# Patient Record
Sex: Female | Born: 1955 | Race: White | Hispanic: No | Marital: Married | Smoking: Never smoker
Health system: Southern US, Community
[De-identification: ages and names within clinical notes are randomized; demographics above are authoritative.]

## PROBLEM LIST (undated history)

## (undated) DIAGNOSIS — E039 Hypothyroidism, unspecified: Secondary | ICD-10-CM

## (undated) DIAGNOSIS — C801 Malignant (primary) neoplasm, unspecified: Secondary | ICD-10-CM

## (undated) HISTORY — DX: Malignant (primary) neoplasm, unspecified: C80.1

## (undated) HISTORY — PX: THYROIDECTOMY: SHX17

---

## 1998-07-24 ENCOUNTER — Encounter: Payer: Self-pay | Admitting: Obstetrics and Gynecology

## 1998-07-24 ENCOUNTER — Ambulatory Visit (HOSPITAL_COMMUNITY): Admission: RE | Admit: 1998-07-24 | Discharge: 1998-07-24 | Payer: Self-pay | Admitting: Obstetrics and Gynecology

## 1998-12-26 ENCOUNTER — Other Ambulatory Visit: Admission: RE | Admit: 1998-12-26 | Discharge: 1998-12-26 | Payer: Self-pay | Admitting: Obstetrics and Gynecology

## 2000-06-23 ENCOUNTER — Other Ambulatory Visit: Admission: RE | Admit: 2000-06-23 | Discharge: 2000-06-23 | Payer: Self-pay | Admitting: Obstetrics and Gynecology

## 2001-07-30 ENCOUNTER — Other Ambulatory Visit: Admission: RE | Admit: 2001-07-30 | Discharge: 2001-07-30 | Payer: Self-pay | Admitting: Obstetrics and Gynecology

## 2001-08-26 ENCOUNTER — Ambulatory Visit (HOSPITAL_COMMUNITY): Admission: RE | Admit: 2001-08-26 | Discharge: 2001-08-26 | Payer: Self-pay | Admitting: Family Medicine

## 2001-08-26 ENCOUNTER — Encounter: Payer: Self-pay | Admitting: Family Medicine

## 2002-10-11 ENCOUNTER — Other Ambulatory Visit: Admission: RE | Admit: 2002-10-11 | Discharge: 2002-10-11 | Payer: Self-pay | Admitting: Obstetrics and Gynecology

## 2004-01-01 ENCOUNTER — Other Ambulatory Visit: Admission: RE | Admit: 2004-01-01 | Discharge: 2004-01-01 | Payer: Self-pay | Admitting: Obstetrics and Gynecology

## 2004-01-05 ENCOUNTER — Ambulatory Visit (HOSPITAL_COMMUNITY): Admission: RE | Admit: 2004-01-05 | Discharge: 2004-01-05 | Payer: Self-pay | Admitting: Obstetrics and Gynecology

## 2004-02-20 ENCOUNTER — Ambulatory Visit (HOSPITAL_COMMUNITY): Admission: RE | Admit: 2004-02-20 | Discharge: 2004-02-20 | Payer: Self-pay | Admitting: Surgery

## 2004-02-20 ENCOUNTER — Encounter (INDEPENDENT_AMBULATORY_CARE_PROVIDER_SITE_OTHER): Payer: Self-pay | Admitting: Specialist

## 2004-04-09 ENCOUNTER — Ambulatory Visit (HOSPITAL_COMMUNITY): Admission: RE | Admit: 2004-04-09 | Discharge: 2004-04-10 | Payer: Self-pay | Admitting: Surgery

## 2004-04-09 ENCOUNTER — Encounter (INDEPENDENT_AMBULATORY_CARE_PROVIDER_SITE_OTHER): Payer: Self-pay | Admitting: *Deleted

## 2005-01-28 ENCOUNTER — Encounter: Admission: RE | Admit: 2005-01-28 | Discharge: 2005-01-28 | Payer: Self-pay | Admitting: Endocrinology

## 2005-01-31 ENCOUNTER — Other Ambulatory Visit: Admission: RE | Admit: 2005-01-31 | Discharge: 2005-01-31 | Payer: Self-pay | Admitting: Obstetrics and Gynecology

## 2005-05-12 ENCOUNTER — Encounter: Admission: RE | Admit: 2005-05-12 | Discharge: 2005-05-12 | Payer: Self-pay | Admitting: Endocrinology

## 2005-10-28 ENCOUNTER — Encounter: Admission: RE | Admit: 2005-10-28 | Discharge: 2005-10-28 | Payer: Self-pay | Admitting: Endocrinology

## 2006-05-11 ENCOUNTER — Encounter: Admission: RE | Admit: 2006-05-11 | Discharge: 2006-05-11 | Payer: Self-pay | Admitting: Endocrinology

## 2008-06-28 ENCOUNTER — Encounter: Admission: RE | Admit: 2008-06-28 | Discharge: 2008-06-28 | Payer: Self-pay | Admitting: Internal Medicine

## 2010-05-26 ENCOUNTER — Encounter: Payer: Self-pay | Admitting: Endocrinology

## 2010-09-20 NOTE — Op Note (Signed)
[name], [name]           ACCOUNT NO.:  [id number]   MEDICAL RECORD NO.:  [id number]          PATIENT TYPE:  OIB   LOCATION:  5705                         FACILITY:  [facility]   PHYSICIAN:  Velora Heckler, MD      DATE OF BIRTH:  11-13-1955   DATE OF PROCEDURE:  04/09/2004  DATE OF DISCHARGE:                                 OPERATIVE REPORT   PREOPERATIVE DIAGNOSIS:  Thyroid nodule.   POSTOPERATIVE DIAGNOSIS:  Papillary thyroid carcinoma.   HISTORY OF PRESENT ILLNESS:  The patient is a 55 year old white female found  to have thyroid nodule on routine physical exam by Dr. Richardean Chimera.  Ultrasound performed in September 2005 demonstrated a 2.5 cm nodule in the  right thyroid lobe.  A 9 mm nodule was noted in the left thyroid lobe.  The  patient underwent fine-needle aspiration cytology of the right lobe.  This  showed nuclear changes with nuclear grooves and was worrisome for follicular  variant of papillary thyroid carcinoma.  The patient now comes to surgery  for right thyroid lobectomy and possible total thyroidectomy.   BODY OF REPORT:  Procedure was done in O.R. #17 at the [facility].  The patient was brought to the operating room and placed  in a supine position on the operating room table.  Following administration  of general anesthesia, the patient was positioned and then prepped and  draped in the usual strict aseptic fashion.  After ascertaining that an  adequate level of anesthesia had been obtained, a Kocher incision was made  with a #15 blade.  Dissection was carried down through subcutaneous tissues  and platysma.  Hemostasis was obtained with the electrocautery.  Skin flaps  were developed cephalad and caudad.  A Mahorner self-retaining retractor was  placed for exposure.  Strap muscles were incised in the midline.  Dissection  was begun on the right side.  Strap muscles were reflected laterally.  Venous tributaries were divided between small  Ligaclips.  Right thyroid lobe  appeared grossly normal, with the exception of a 2 cm nodule which was in  the superior pole located posteriorly.  This was gently mobilized.  Superior  pole vessels were ligated in continuity with 2-0 silk ties, medium  Ligaclips, and divided.  Inferior venous tributaries were ligated in  continuity with 2-0 silk ties and divided.  Gland was rolled anteriorly.  Care was taken to preserve parathyroid tissue and the recurrent laryngeal  nerve.  Branches of the inferior thyroid artery were divided between small  Ligaclips.  Ligament of Allyson [name] was transected, and the gland was rolled up  and onto the anterior trachea.  It was mobilized across the midline.  Isthmus was divided between hemostats.  Right thyroid lobe was completely  excised and was submitted to pathology.  Dr. Berneta Levins did a frozen  section showing a follicular lesion with Hurthle cell change.  There was no  overt sign of malignancy.  Isthmus was suture ligated with 3-0 Vicryl suture  ligature.   Next, the left thyroid lobe was explored.  There was a small  isolated firm  nodule in the inferior pole located posteriorly.  This was gently mobilized.  The remainder of the left lobe appeared normal.  The inferior venous  tributaries were divided between small Ligaclips.  The inferior nodule was  then excised using the electrocautery to completely excise the nodule from  the surrounding normal parenchyma.  Just the nodule was submitted to Dr. Berneta Levins for frozen section.  Frozen section does show papillary thyroid  carcinoma.   Therefore, the remaining thyroid lobe was completely excised.  This was  performed by ligating the superior pole vessels in continuity with 2-0 silk  ties and medium Ligaclips and dividing.  Gland was rolled anteriorly.  Branches of the inferior thyroid artery were divided between small  Ligaclips.  Parathyroid tissue and recurrent laryngeal nerve were identified  and  preserved.  Ligament of Allyson [name] was transected with the electrocautery,  and the gland was rolled up and onto the trachea and completely excised off  the anterior trachea.  Left lobe was then submitted to pathology for  permanent sectioning.  Neck was irrigated with warm saline.  Good hemostasis  was noted.  Surgicel was placed over the area of the recurrent nerves  bilaterally.  The strap muscles were reapproximated in the midline with  interrupted 3-0 Vicryl sutures.  Platysma was closed with interrupted 3-0  Vicryl sutures.  Skin was closed with a running 4-0 Vicryl subcuticular  suture.  Wound was washed and dried, and Benzoin and Steri-Strips were  applied.  Sterile dressings were applied.  The patient was awakened from  anesthesia and brought to the recovery room in stable condition.  The  patient tolerated the procedure well.      Todd   TMG/MEDQ  D:  04/09/2004  T:  04/09/2004  Job:  [id number]   cc:   Velora Heckler, MD  [address]  [place]  [place] [address]  Fax: [phone]   Juluis Mire, M.D.  [address] [place]  [place] [address]  Fax: [phone]   Deboraha Sprang at [facility]

## 2010-12-31 ENCOUNTER — Emergency Department (HOSPITAL_COMMUNITY)
Admission: EM | Admit: 2010-12-31 | Discharge: 2011-01-01 | Disposition: A | Discharge status: Home / Self Care | Payer: BC Managed Care – PPO | Attending: Emergency Medicine | Admitting: Emergency Medicine

## 2010-12-31 DIAGNOSIS — R110 Nausea: Secondary | ICD-10-CM | POA: Insufficient documentation

## 2010-12-31 DIAGNOSIS — Z79899 Other long term (current) drug therapy: Secondary | ICD-10-CM | POA: Insufficient documentation

## 2010-12-31 DIAGNOSIS — R1013 Epigastric pain: Secondary | ICD-10-CM | POA: Insufficient documentation

## 2010-12-31 DIAGNOSIS — Z85850 Personal history of malignant neoplasm of thyroid: Secondary | ICD-10-CM | POA: Insufficient documentation

## 2011-01-01 ENCOUNTER — Emergency Department (HOSPITAL_COMMUNITY): Payer: BC Managed Care – PPO

## 2011-01-01 LAB — HEPATIC FUNCTION PANEL
ALT: 20 U/L (ref 0–35)
Bilirubin, Direct: <0.1 mg/dL (ref 0.0–0.3)

## 2011-01-01 LAB — DIFFERENTIAL
Basophils Relative: 0 % (ref 0–1)
Lymphocytes Relative: 34 % (ref 12–46)
Neutro Abs: 4.8 10*3/uL (ref 1.7–7.7)

## 2011-01-01 LAB — CBC
MCH: 30.1 pg (ref 26.0–34.0)
MCHC: 33.7 g/dL (ref 30.0–36.0)
MCV: 89.3 fL (ref 78.0–100.0)
RBC: 4.88 MIL/uL (ref 3.87–5.11)
RDW: 13.4 % (ref 11.5–15.5)
WBC: 8.5 10*3/uL (ref 4.0–10.5)

## 2011-01-01 LAB — BASIC METABOLIC PANEL
CO2: 30 mEq/L (ref 19–32)
Calcium: 10.7 mg/dL — ABNORMAL HIGH (ref 8.4–10.5)
GFR calc non Af Amer: >60 mL/min (ref >60)
Glucose, Bld: 81 mg/dL (ref 70–99)
Potassium: 3.7 mEq/L (ref 3.5–5.1)

## 2011-01-01 LAB — LIPASE, BLOOD: Lipase: 34 U/L (ref 11–59)

## 2011-01-01 MED ORDER — IOHEXOL 300 MG/ML  SOLN
100.0000 mL | Freq: Once | INTRAMUSCULAR | Status: AC | PRN
  Administered 2011-01-01: 100 mL via INTRAVENOUS

## 2012-03-17 IMAGING — US US ABDOMEN COMPLETE
1 series · 14 of 25 positions shown · non-contrast
Comparison: None

CLINICAL DATA: Abdominal pain.

COMPLETE ABDOMINAL ULTRASOUND

[Series 1: us abdomen complete · 0.30mm/px · 14 of 45 slices shown]
[im 1/45]
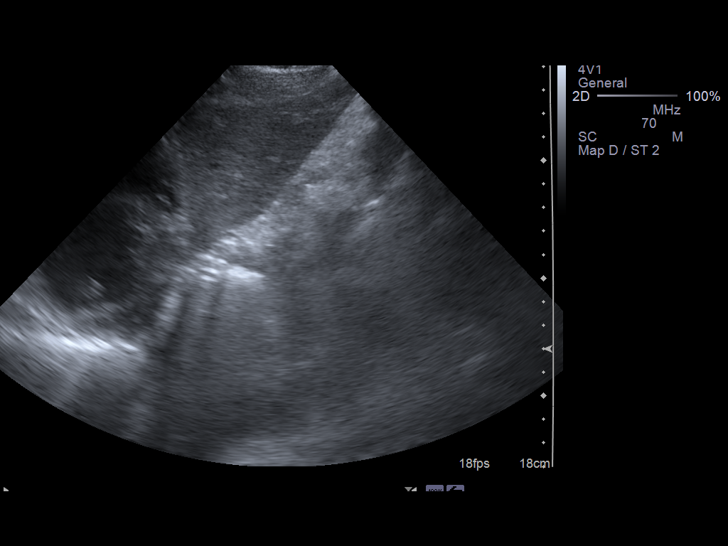
[im 4/45]
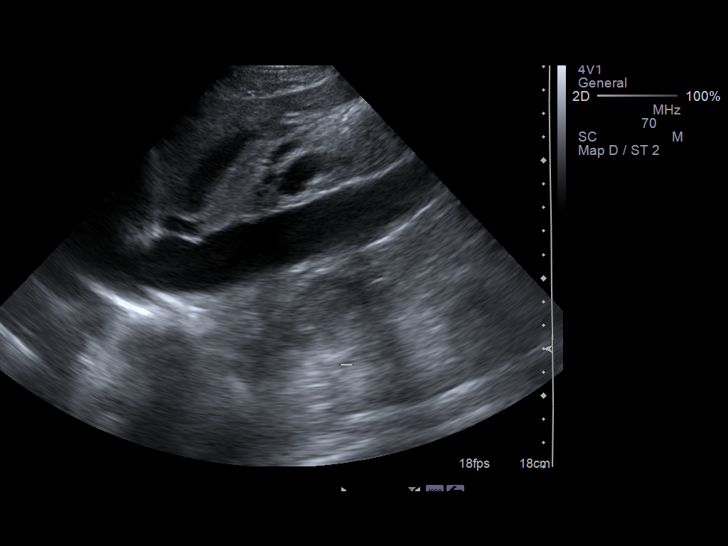
[im 8/45]
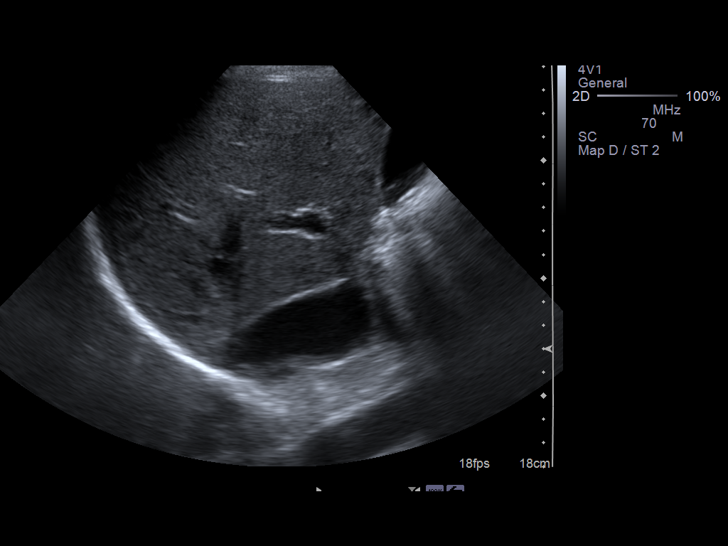
[im 12/45]
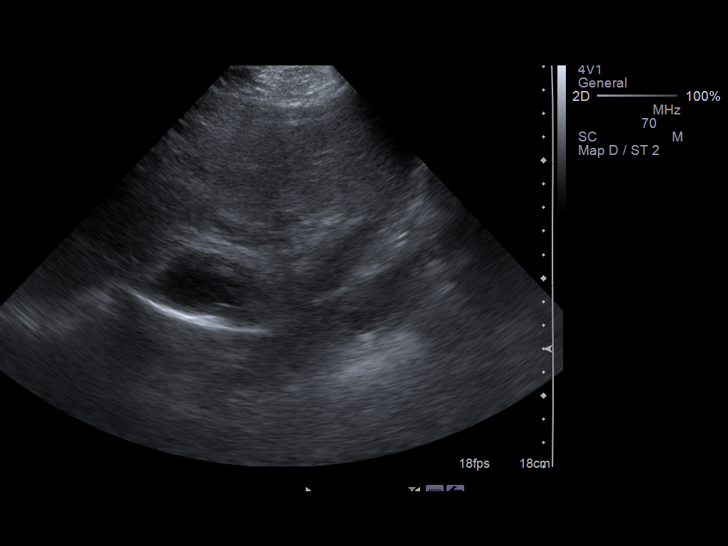
[im 15/45]
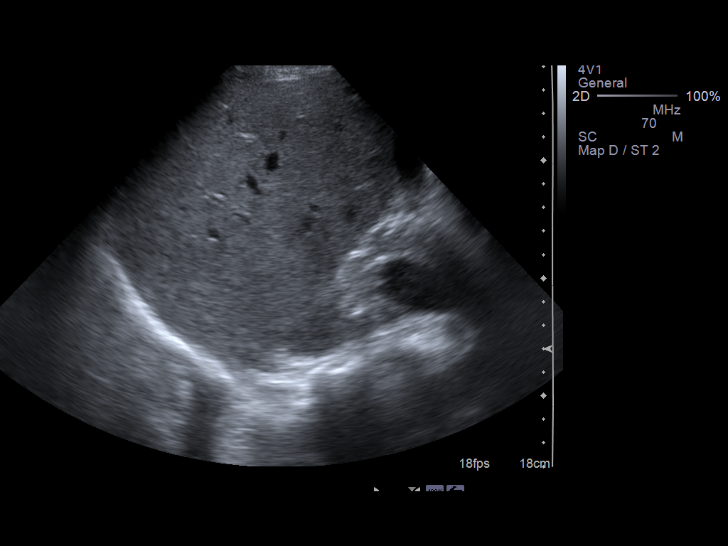
[im 17/45]
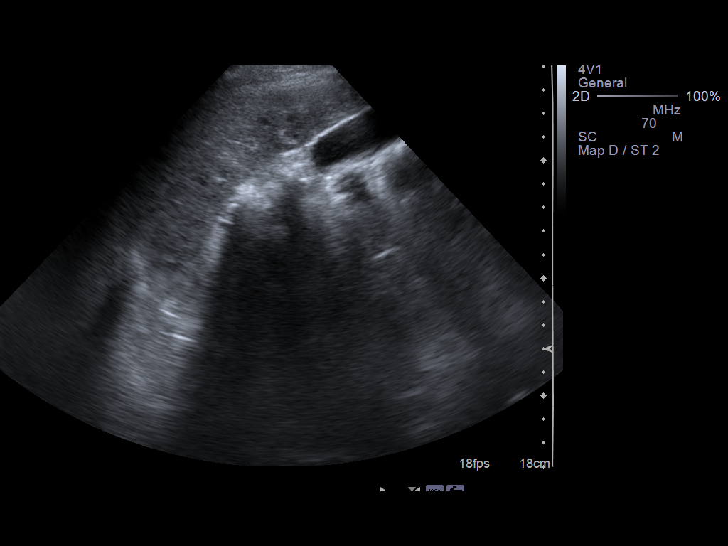
[im 21/45]
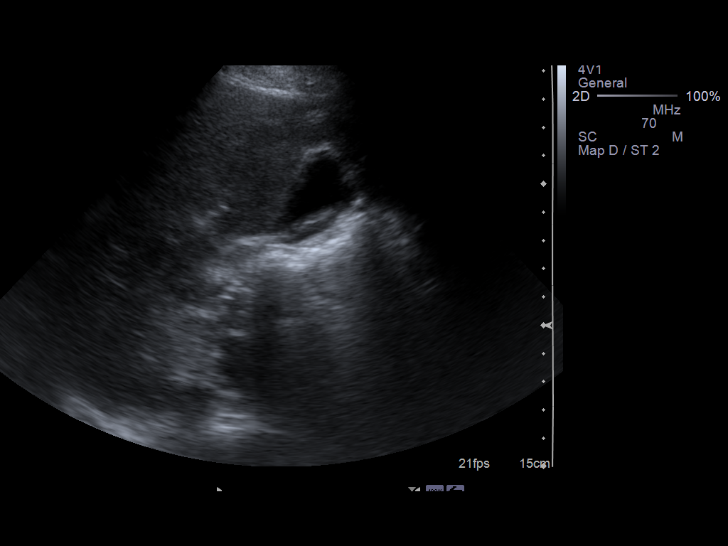
[im 24/45]
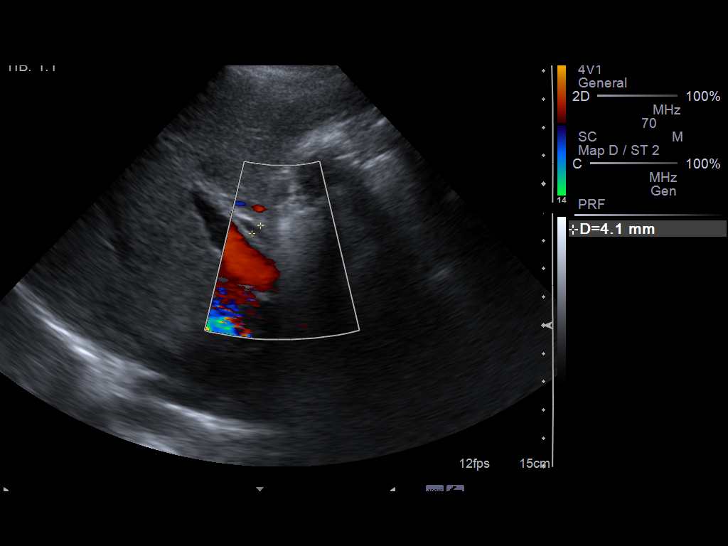
[im 28/45]
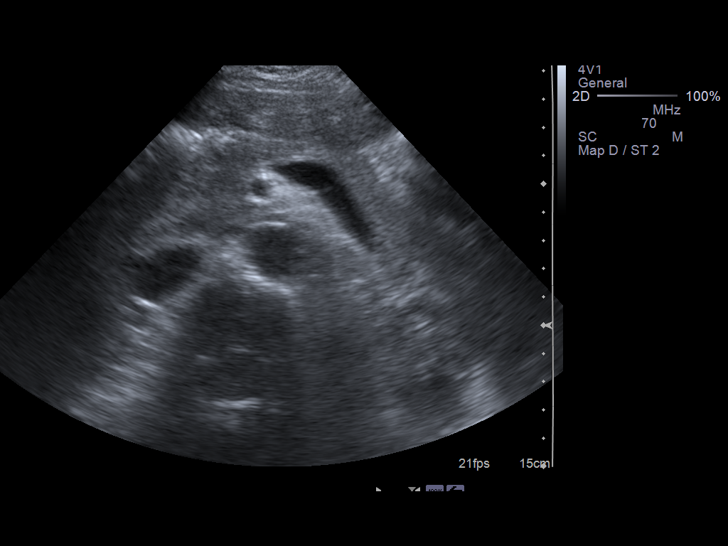
[im 30/45]
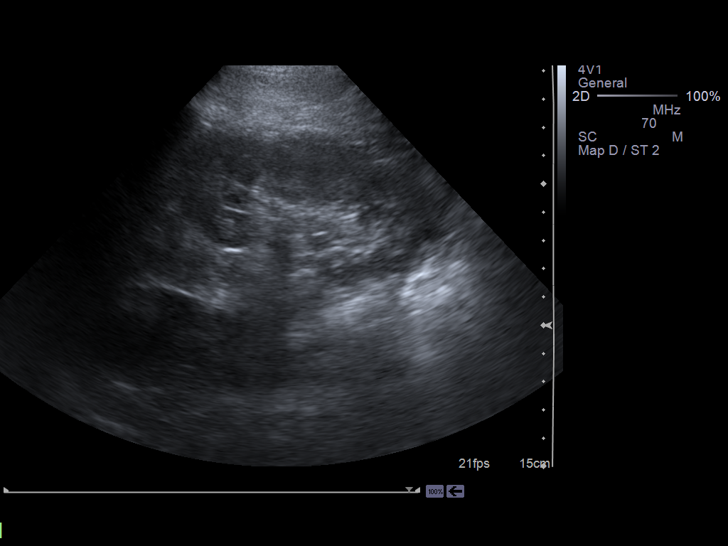
[im 34/45]
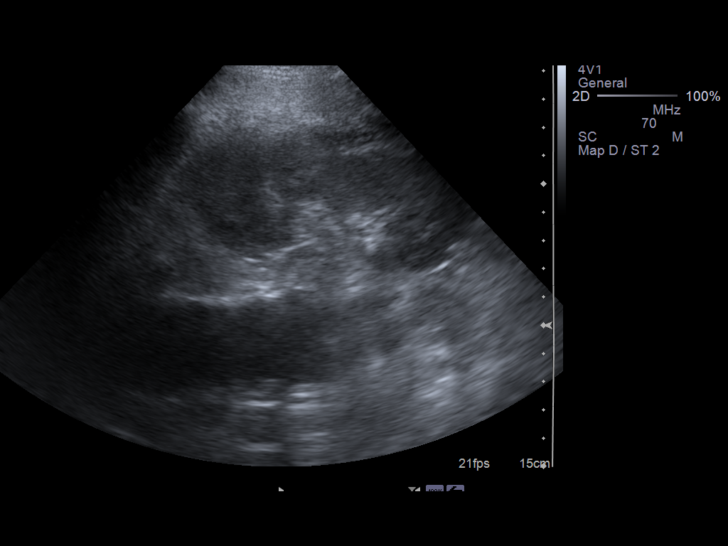
[im 37/45]
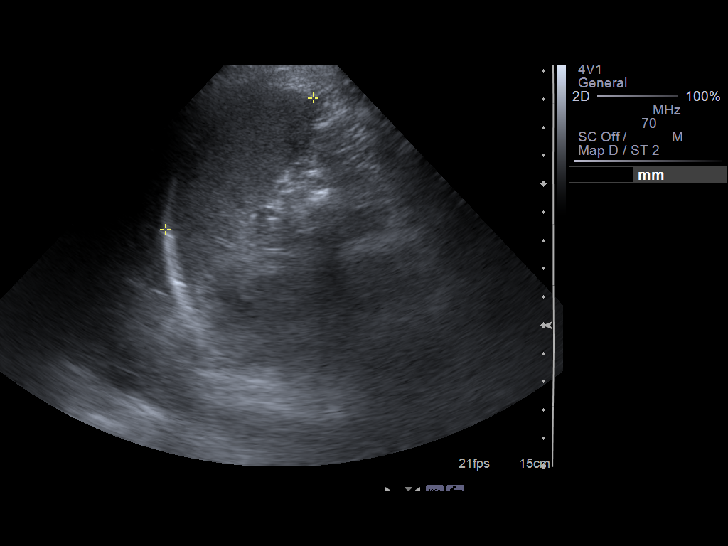
[im 41/45]
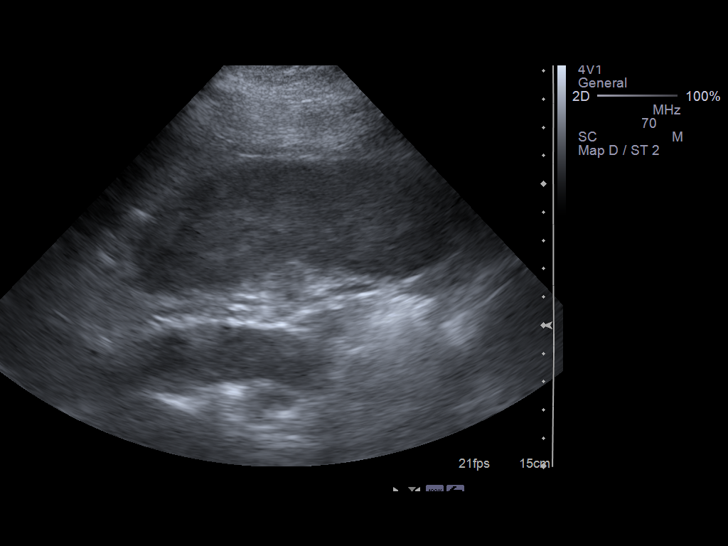
[im 45/45]
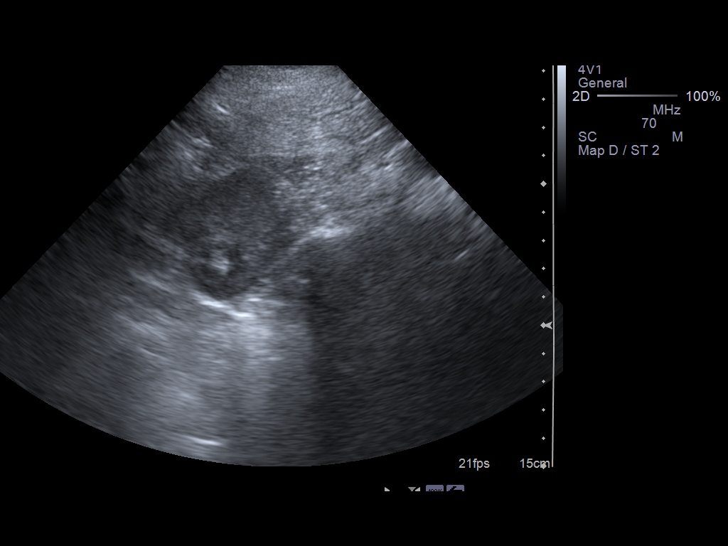

[14 of 25 positions shown; findings below may reference images not displayed]

FINDINGS: Gallbladder:  Normal sonographic appearance.  No gallstones, wall
thickening, or pericholecystic fluid.  Negative sonographic
Murphy's sign.

Common bile duct:  Normal diameter, measuring 4 mm.

Liver:  Normal echogenicity.  No focal lesion identified.

IVC:  Normal appearance.

Pancreas:  Limited visualization, normal where seen. Mild fullness
of the tail is nonspecific.  No focal lesion identified.

Spleen:  Normal in echogenicity and size.

Right Kidney:  Normal echogenicity.  No hydronephrosis.  Measures
10.6 cm.

Left Kidney:  Normal echogenicity.  No hydronephrosis.  Measures
11.7 cm.

Abdominal aorta:  No aneurysm identified.  Measures up to 2.5 cm.
IMPRESSION: No sonographic abnormality to explain the patient's abdominal pain.

## 2013-07-14 ENCOUNTER — Ambulatory Visit (INDEPENDENT_AMBULATORY_CARE_PROVIDER_SITE_OTHER): Payer: BC Managed Care – PPO | Admitting: Licensed Clinical Social Worker

## 2013-07-14 DIAGNOSIS — F4323 Adjustment disorder with mixed anxiety and depressed mood: Secondary | ICD-10-CM

## 2013-07-25 ENCOUNTER — Ambulatory Visit: Payer: BC Managed Care – PPO | Admitting: Licensed Clinical Social Worker

## 2014-06-13 ENCOUNTER — Other Ambulatory Visit: Payer: Self-pay | Admitting: Obstetrics and Gynecology

## 2014-06-14 LAB — CYTOLOGY - PAP

## 2014-07-16 ENCOUNTER — Ambulatory Visit (INDEPENDENT_AMBULATORY_CARE_PROVIDER_SITE_OTHER): Payer: BLUE CROSS/BLUE SHIELD

## 2014-07-16 ENCOUNTER — Telehealth: Payer: Self-pay

## 2014-07-16 ENCOUNTER — Ambulatory Visit (INDEPENDENT_AMBULATORY_CARE_PROVIDER_SITE_OTHER): Payer: BLUE CROSS/BLUE SHIELD | Admitting: Physician Assistant

## 2014-07-16 VITALS — BP 116/64 | HR 67 | Temp 97.9°F | Resp 16 | Ht 64.5 in | Wt 157.0 lb

## 2014-07-16 DIAGNOSIS — M79674 Pain in right toe(s): Secondary | ICD-10-CM

## 2014-07-16 DIAGNOSIS — S92424A Nondisplaced fracture of distal phalanx of right great toe, initial encounter for closed fracture: Secondary | ICD-10-CM | POA: Diagnosis not present

## 2014-07-16 DIAGNOSIS — E039 Hypothyroidism, unspecified: Secondary | ICD-10-CM | POA: Insufficient documentation

## 2014-07-16 MED ORDER — HYDROCODONE-ACETAMINOPHEN 5-325 MG PO TABS: 1.0000 | ORAL_TABLET | Freq: Four times a day (QID) | ORAL | Status: DC | PRN

## 2014-07-16 NOTE — Telephone Encounter (Signed)
Patient dropped off reimbursement forms to [employer] to complete  [phone] Jerilynn Mages)

## 2014-07-16 NOTE — Patient Instructions (Signed)
Recheck in 14 days Have a good intake of calcium

## 2014-07-16 NOTE — Progress Notes (Signed)
   Subjective:    Patient ID: Allison [name], female    DOB: 09-14-1955, 59 y.o.   MRN: [id number]  HPI Pt presents to clinic with right great toe pain after she dropped a loaded drawer on her right great toe 24h ago.  She had immediate pain and has used some motrin and ice but the pain is not getting better.    She has a bone density scheduled in 2 days for screening.  She takes calcium daily.  Review of Systems  Constitutional: Negative for fever and chills.  Musculoskeletal: Positive for gait problem (2nd to pain).       Objective:   Physical Exam  Constitutional: She appears well-developed and well-nourished.  BP 116/64 mmHg  Pulse 67  Temp(Src) 97.9 F (36.6 C)  Resp 16  Ht 5' 4.5" (1.638 m)  Wt 157 lb (71.215 kg)  BMI 26.54 kg/m2  SpO2 98%   HENT:  Head: Normocephalic and atraumatic.  Right Ear: External ear normal.  Left Ear: External ear normal.  Pulmonary/Chest: Effort normal.  Musculoskeletal:       Feet:  Antalgic gait favoring right side  Neurological: She is alert.  Skin: Skin is warm and dry.  Psychiatric: She has a normal mood and affect. Her behavior is normal. Judgment and thought content normal.    [facility] reading (PRIMARY) by  Dr. Everlene Farrier.  Fracture distal phalanx right great toe.     Assessment & Plan:  Closed nondisplaced fracture of distal phalanx of right great toe, initial encounter - Plan: HYDROcodone-acetaminophen (NORCO/VICODIN) 5-325 MG per tablet  Great toe pain, right - Plan: DG Toe Great Right   Post-op shoe.  Recheck in 14 days - sooner if worse.  Take 1500mg  calcium daily.  Windell Hummingbird PA-C  [facility] 07/16/2014 1:50 PM

## 2014-07-16 NOTE — Telephone Encounter (Signed)
For is ready to be copied and then for patient to pick up

## 2014-07-18 NOTE — Telephone Encounter (Signed)
Called pt, advised forms are ready to pick up.

## 2014-08-30 ENCOUNTER — Ambulatory Visit (INDEPENDENT_AMBULATORY_CARE_PROVIDER_SITE_OTHER): Payer: BLUE CROSS/BLUE SHIELD | Admitting: Urgent Care

## 2014-08-30 VITALS — BP 120/80 | HR 72 | Temp 97.6°F | Resp 16 | Ht 65.0 in | Wt 154.0 lb

## 2014-08-30 DIAGNOSIS — R51 Headache: Secondary | ICD-10-CM | POA: Diagnosis not present

## 2014-08-30 DIAGNOSIS — J101 Influenza due to other identified influenza virus with other respiratory manifestations: Secondary | ICD-10-CM | POA: Diagnosis not present

## 2014-08-30 DIAGNOSIS — R05 Cough: Secondary | ICD-10-CM | POA: Diagnosis not present

## 2014-08-30 DIAGNOSIS — M791 Myalgia, unspecified site: Secondary | ICD-10-CM

## 2014-08-30 DIAGNOSIS — R519 Headache, unspecified: Secondary | ICD-10-CM

## 2014-08-30 DIAGNOSIS — R110 Nausea: Secondary | ICD-10-CM

## 2014-08-30 DIAGNOSIS — R059 Cough, unspecified: Secondary | ICD-10-CM

## 2014-08-30 LAB — POCT INFLUENZA A/B
INFLUENZA A, POC: POSITIVE
INFLUENZA B, POC: NEGATIVE

## 2014-08-30 MED ORDER — KETOROLAC TROMETHAMINE 60 MG/2ML IM SOLN
60.0000 mg | Freq: Once | INTRAMUSCULAR | Status: AC
  Administered 2014-08-30: 60 mg via INTRAMUSCULAR

## 2014-08-30 MED ORDER — ONDANSETRON 4 MG PO TBDP
8.0000 mg | ORAL_TABLET | Freq: Once | ORAL | Status: AC
  Administered 2014-08-30: 8 mg via ORAL

## 2014-08-30 MED ORDER — ONDANSETRON 8 MG PO TBDP: 8.0000 mg | ORAL_TABLET | Freq: Three times a day (TID) | ORAL | Status: DC | PRN

## 2014-08-30 NOTE — Progress Notes (Signed)
    MRN: [id number] DOB: Jan 08, 1956  Subjective:   Allison [name] is a 59 y.o. female presenting for chief complaint of Headache and Flu like symptoms  Reports 2 day history of headache, subjective fever, myalgia, nasal congestion, sinus pressure, dry cough, nausea. Has tried Alleve, Tylenol, ibuprofen with minimal relief. Patient did take flu shot this year. Had sick contacts at work, ~1-2 weeks ago. Denies itchy or watery red eyes, ear pain, ear drainage, tooth pain, sore throat, chest pain, chest tightness, shob, wheezing, abdominal pain, diarrhea. Denies history of seasonal allergies, history of asthma. Denies history of kidney or liver disease. Denies any other aggravating or relieving factors, no other questions or concerns.  Allison [name] is currently taking levothyroxine. She is allergic to biaxin.  Allison [name]  has a past medical history of Cancer. Also  has past surgical history that includes Thyroidectomy.  ROS As in subjective.  Objective:   Vitals: BP 120/80 mmHg  Pulse 72  Temp(Src) 97.6 F (36.4 C) (Oral)  Resp 16  Ht 5\' 5"  (1.651 m)  Wt 154 lb (69.854 kg)  BMI 25.63 kg/m2  SpO2 98%  Physical Exam  Constitutional: She is well-developed, well-nourished, and in no distress.  HENT:  TM's intact bilaterally, no effusions or erythema. Nares patent, nasal turbinates pink and moist. No sinus tenderness. Oropharynx clear, mucous membranes moist, dentition in good repair.  Eyes: Conjunctivae are normal. Right eye exhibits no discharge. Left eye exhibits no discharge. No scleral icterus.  Neck: Normal range of motion. Neck supple.  Cardiovascular: Normal rate, regular rhythm and intact distal pulses.  Exam reveals no gallop and no friction rub.   No murmur heard. Pulmonary/Chest: No stridor. No respiratory distress. She has no wheezes. She has no rales. She exhibits no tenderness.  Abdominal: Soft. Bowel sounds are normal. She exhibits no distension and no mass. There is no  tenderness.  Lymphadenopathy:    She has no cervical adenopathy.  Skin: Skin is warm and dry. No rash noted. No erythema. No pallor.   Results for orders placed or performed in visit on 08/30/14 (from the past 24 hour(s))  POCT Influenza A/B     Status: Abnormal   Collection Time: 08/30/14  8:52 AM  Result Value Ref Range   Influenza A, POC Positive    Influenza B, POC Negative    Assessment and Plan :   1. Nonintractable headache, unspecified chronicity pattern, unspecified headache type 2. Myalgia 3. Cough 4. Nausea without vomiting 5. Influenza A - Offered supportive care, rest from work, return to clinic in 3 days if symptoms fail to resolve  Jaynee Eagles, PA-C [facility] [phone] 08/30/2014 8:24 AM

## 2014-08-30 NOTE — Patient Instructions (Signed)

## 2015-09-30 IMAGING — CR DG TOE GREAT 2+V*R*
2 series · 2 of 2 positions shown · non-contrast
Comparison: None.

CLINICAL DATA: Right great toe pain after drop loaded drawer on
great toe.

EXAM:
RIGHT GREAT TOE

[AP]
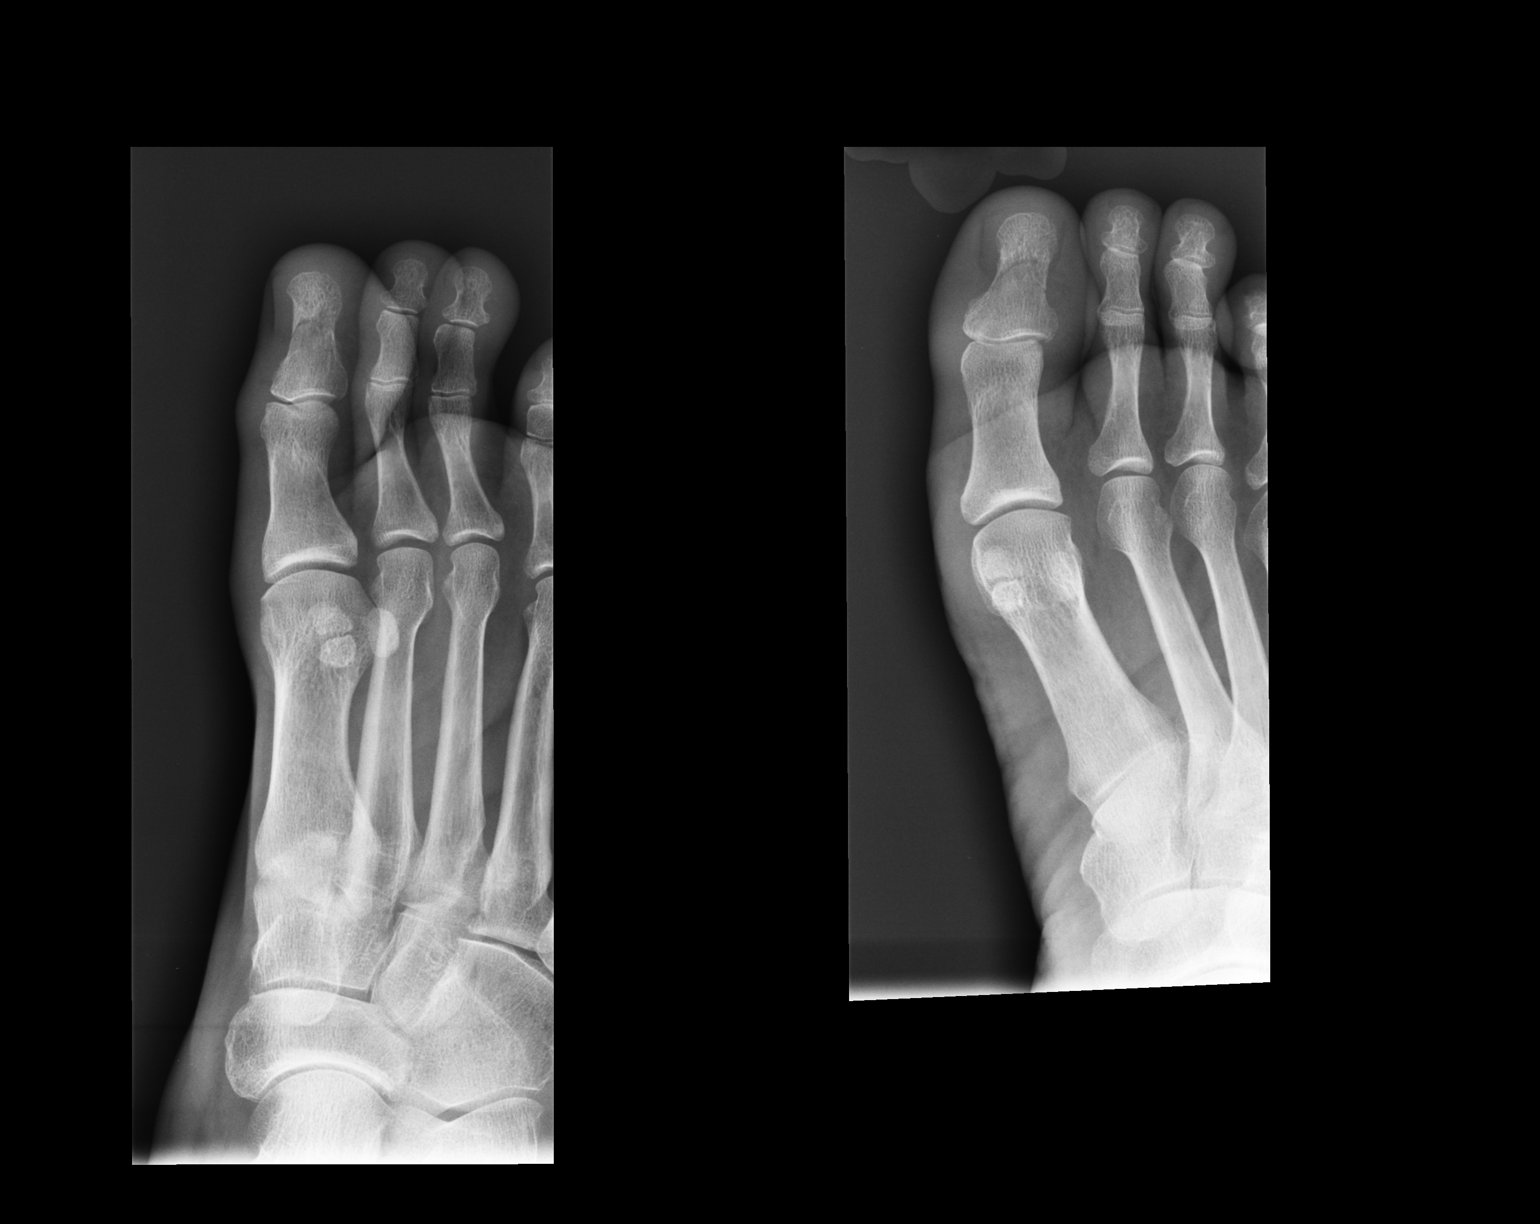

[oblique]
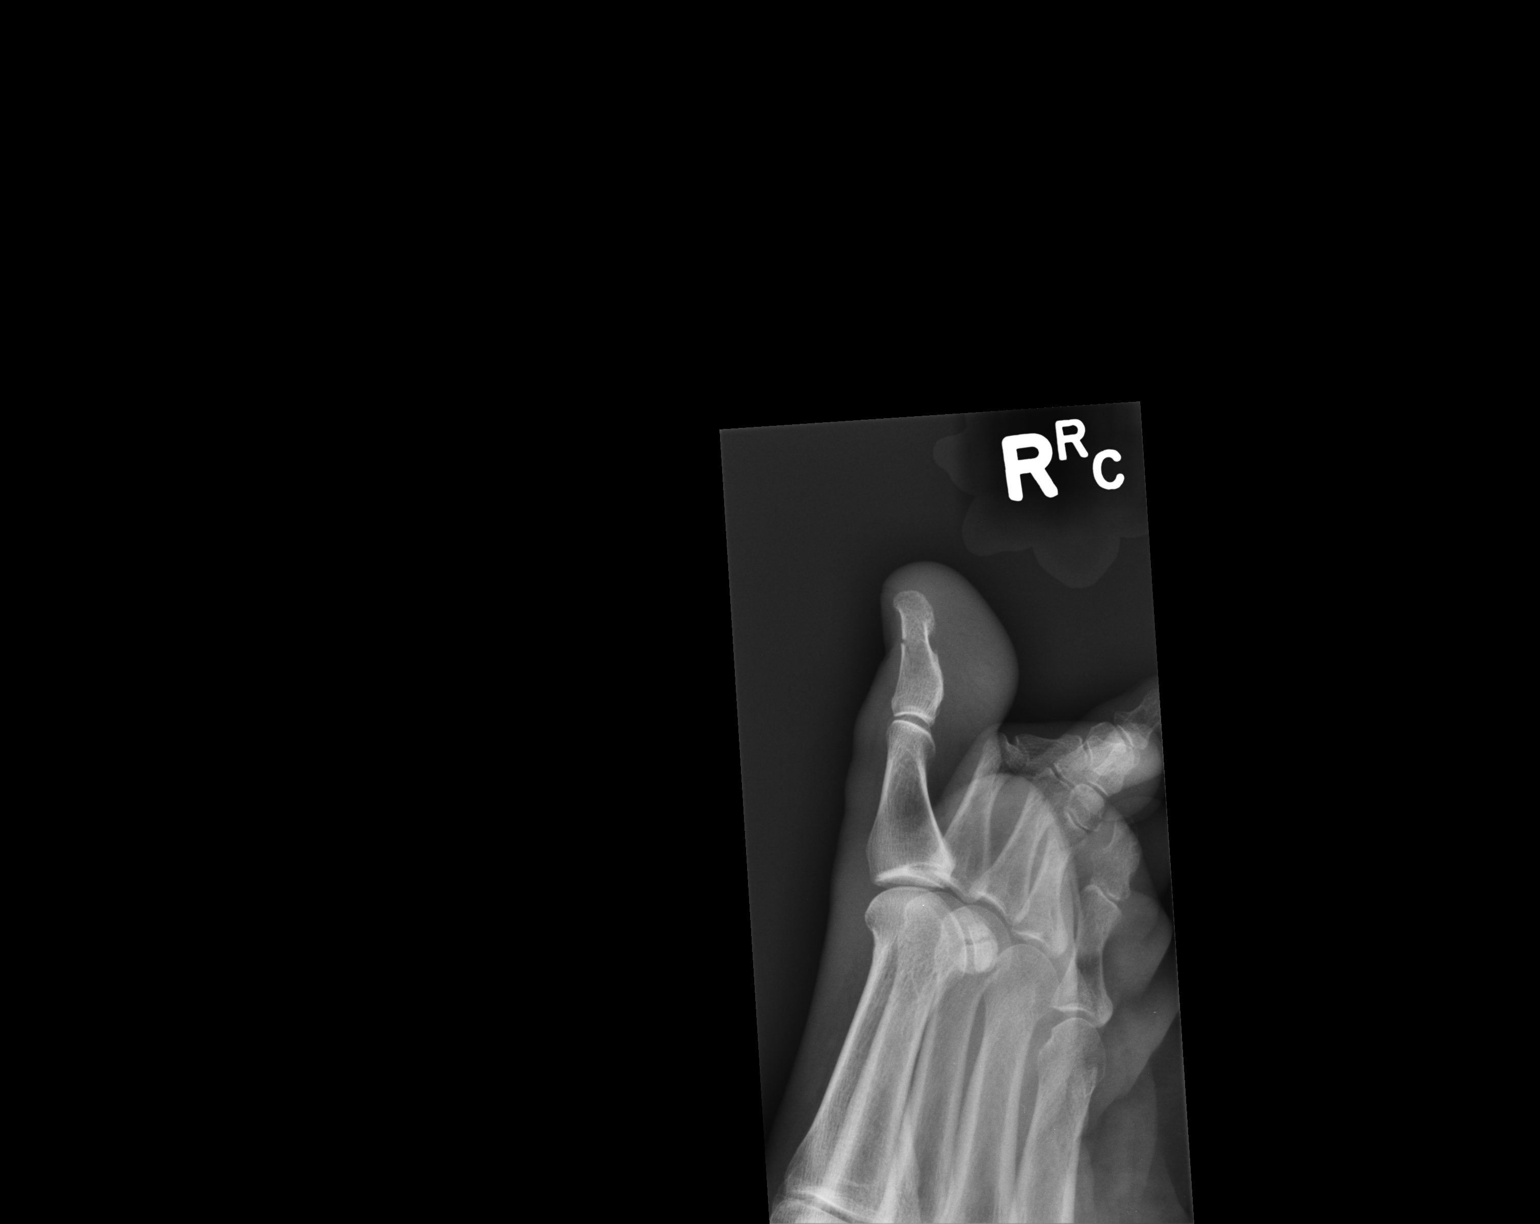

[2 of 2 positions shown; findings below may reference images not displayed]

FINDINGS: Nondisplaced fracture involves the mid shaft of the distal phalanx.
No dislocation. No radiopaque foreign bodies.
IMPRESSION: Acute fracture involves the mid shaft of the distal phalanx.

## 2017-01-30 ENCOUNTER — Other Ambulatory Visit (HOSPITAL_COMMUNITY): Payer: Self-pay | Admitting: Obstetrics and Gynecology

## 2017-02-03 ENCOUNTER — Encounter (HOSPITAL_COMMUNITY): Payer: Self-pay

## 2017-02-03 NOTE — Patient Instructions (Signed)
Allison [name]  02/03/2017      Your procedure is scheduled on 02/16/2017    Report to [facility].  Call this number if you have problems the morning of surgery:[phone]             OUR ADDRESS IS [place] , WE ARE LOCATED IN [place].    Remember:  Do not eat food or drink liquids after midnight.  Take these medicines the morning of surgery with A SIP OF WATER: SYNTHROID   Do not wear jewelry, make-up or nail polish.  Do not wear lotions, powders, or perfumes, or deoderant.  Do not shave 48 hours prior to surgery.  Men may shave face and neck.  Do not bring valuables to the hospital.  [facility] is not responsible for any belongings or valuables.  Contacts, dentures or bridgework may not be worn into surgery.  Leave your suitcase in the car.  After surgery it may be brought to your room.  For patients admitted to the hospital, discharge time will be determined by your treatment team.   Special instructions:   Please read over the following fact sheets that you were given.    [facility] - Preparing for Surgery Before surgery, you can play an important role.  Because skin is not sterile, your skin needs to be as free of germs as possible.  You can reduce the number of germs on your skin by washing with CHG (chlorahexidine gluconate) soap before surgery.  CHG is an antiseptic cleaner which kills germs and bonds with the skin to continue killing germs even after washing. Please DO NOT use if you have an allergy to CHG or antibacterial soaps.  If your skin becomes reddened/irritated stop using the CHG and inform your nurse when you arrive at Short Stay. Do not shave (including legs and underarms) for at least 48 hours prior to the first CHG shower.  You may shave your face/neck. Please follow these instructions carefully:  1.  Shower with CHG Soap the night before surgery and the   morning of Surgery.  2.  If you choose to wash your hair, wash your hair first as usual with your  normal  shampoo.  3.  After you shampoo, rinse your hair and body thoroughly to remove the  shampoo.                           4.  Use CHG as you would any other liquid soap.  You can apply chg directly  to the skin and wash                       Gently with a scrungie or clean washcloth.  5.  Apply the CHG Soap to your body ONLY FROM THE NECK DOWN.   Do not use on face/ open                           Wound or open sores. Avoid contact with eyes, ears mouth and genitals (private parts).  Wash face,  Genitals (private parts) with your normal soap.             6.  Wash thoroughly, paying special attention to the area where your surgery  will be performed.  7.  Thoroughly rinse your body with warm water from the neck down.  8.  DO NOT shower/wash with your normal soap after using and rinsing off  the CHG Soap.                9.  Pat yourself dry with a clean towel.            10.  Wear clean pajamas.            11.  Place clean sheets on your bed the night of your first shower and do not  sleep with pets. Day of Surgery : Do not apply any lotions/deodorants the morning of surgery.  Please wear clean clothes to the hospital/surgery center.  FAILURE TO FOLLOW THESE INSTRUCTIONS MAY RESULT IN THE CANCELLATION OF YOUR SURGERY PATIENT SIGNATURE_________________________________  NURSE SIGNATURE__________________________________  ________________________________________________________________________

## 2017-02-04 NOTE — H&P (Signed)
Patient Allison [name], Allison [name]  [facility], MD 02/04/2017 9:19 AM

## 2017-02-04 NOTE — H&P (Signed)
NAMERAUL, WINTERHALTER NO.:  [id number]  MEDICAL RECORD NO.:  [id number]  LOCATION:                                 FACILITY:  PHYSICIAN:  Darlyn Chamber, M.D.        DATE OF BIRTH:  DATE OF ADMISSION: DATE OF DISCHARGE:                             HISTORY & PHYSICAL   DATE OF SURGERY:  February 16, 2017, at [facility] [place].  [place]:  The patient is a 61 year old, gravida 2, para 2, postmenopausal patient, who presents for laparoscopic-assisted vaginal hysterectomy.  We are also going to do vaginal repair of a cystocele as well as a rectocele, and we will do a sacrospinous ligament suspension.  In relation to the present admission, the patient does have worsening pelvic relaxation that is becoming increasingly symptomatic for her.  We discussed options including pessary or conservative management.  We did do urodynamic testing, really could not demonstrate leakage with coughing or sneezing.  She now presents for the above-noted surgery.  ALLERGIES:  She is allergic to [facility].  MEDICATIONS:  Include Synthroid 112 mcg.  PAST MEDICAL HISTORY:  Usual childhood diseases.  PAST SURGICAL HISTORY:  She has had 2 vaginal deliveries and a thyroidectomy  SOCIAL HISTORY:  Reveals no tobacco and minimal alcohol use.  FAMILY HISTORY:  Noncontributory.  REVIEW OF SYSTEMS:  Noncontributory.  PHYSICAL EXAMINATION:  VITAL SIGNS:  The patient is afebrile.  Stable vital signs. HEENT:  The patient is normocephalic.  Pupils equal, round, and reactive to light and accommodation.  Extraocular movements were intact.  Sclerae and conjunctiva clear.  Oropharynx clear. NECK:  Without thyromegaly. BREASTS:  Not examined. LUNGS:  Clear. CARDIOVASCULAR SYSTEM:  Regular rate.  No murmurs or gallops. ABDOMEN:  Benign.  No mass, organomegaly or tenderness. PELVIC:  Normal external genitalia.  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 Uterus normal size and shape.  Adnexa unremarkable. EXTREMITIES:  Trace edema. NEUROLOGIC:  Grossly within normal limits.  IMPRESSION: 1. Worsening pelvic relaxation. 2. Previous thyroidectomy for thyroid cancer.  PLAN:  The patient to undergo laparoscopic-assisted vaginal hysterectomy with bilateral salpingo-oophorectomy.  We will do anterior posterior repair and sacrospinous ligament suspension.  Cystoscopy will be performed.  Risks of surgery have been discussed including the risk of infection.  Risk of hemorrhage that could require transfusion with the risk of AIDS or hepatitis.  Risk of injury to adjacent organs including bladder, bowel, ureters that could require further exploratory surgery. Risk of deep venous thrombosis and pulmonary embolus.  The patient does understand the indications, risks, and alternatives.     Darlyn Chamber, M.D.     JSM/MEDQ  D:  02/04/2017  T:  02/04/2017  Job:  [id number]

## 2017-02-06 ENCOUNTER — Encounter (HOSPITAL_COMMUNITY)
Admission: RE | Admit: 2017-02-06 | Discharge: 2017-02-06 | Disposition: A | Discharge status: Home / Self Care | Payer: BLUE CROSS/BLUE SHIELD | Source: Ambulatory Visit | Attending: Obstetrics and Gynecology | Admitting: Obstetrics and Gynecology

## 2017-02-06 ENCOUNTER — Encounter (HOSPITAL_COMMUNITY): Payer: Self-pay

## 2017-02-06 DIAGNOSIS — Z85850 Personal history of malignant neoplasm of thyroid: Secondary | ICD-10-CM | POA: Diagnosis not present

## 2017-02-06 DIAGNOSIS — N8189 Other female genital prolapse: Secondary | ICD-10-CM | POA: Diagnosis not present

## 2017-02-06 DIAGNOSIS — Z01812 Encounter for preprocedural laboratory examination: Secondary | ICD-10-CM | POA: Insufficient documentation

## 2017-02-06 DIAGNOSIS — E890 Postprocedural hypothyroidism: Secondary | ICD-10-CM | POA: Diagnosis not present

## 2017-02-06 HISTORY — DX: Hypothyroidism, unspecified: E03.9

## 2017-02-06 LAB — CBC
HCT: 40.4 % (ref 36.0–46.0)
Hemoglobin: 13.5 g/dL (ref 12.0–15.0)
MCH: 30.8 pg (ref 26.0–34.0)
MCHC: 33.4 g/dL (ref 30.0–36.0)
MCV: 92.2 fL (ref 78.0–100.0)
PLATELETS: 239 10*3/uL (ref 150–400)
RBC: 4.38 MIL/uL (ref 3.87–5.11)
RDW: 13.6 % (ref 11.5–15.5)
WBC: 5.7 10*3/uL (ref 4.0–10.5)

## 2017-02-06 LAB — BASIC METABOLIC PANEL
ANION GAP: 8 (ref 5–15)
BUN: 20 mg/dL (ref 6–20)
CALCIUM: 9.6 mg/dL (ref 8.9–10.3)
CO2: 28 mmol/L (ref 22–32)
CREATININE: 0.85 mg/dL (ref 0.44–1.00)
Chloride: 103 mmol/L (ref 101–111)
GFR calc Af Amer: >60 mL/min (ref >60)
GLUCOSE: 67 mg/dL (ref 65–99)
Potassium: 4.5 mmol/L (ref 3.5–5.1)
Sodium: 139 mmol/L (ref 135–145)

## 2017-02-06 LAB — ABO/RH: ABO/RH(D): A POS

## 2017-02-15 NOTE — Anesthesia Preprocedure Evaluation (Signed)
Anesthesia Evaluation  Patient identified by MRN, date of birth, ID band Patient awake    Reviewed: Allergy & Precautions, H&P , Patient's Chart, lab work & pertinent test results, reviewed documented beta blocker date and time   Airway Mallampati: II  TM Distance: >3 FB Neck ROM: full    Dental no notable dental hx.    Pulmonary    Pulmonary exam normal breath sounds clear to auscultation       Cardiovascular  Rhythm:regular Rate:Normal     Neuro/Psych    GI/Hepatic   Endo/Other    Renal/GU      Musculoskeletal   Abdominal   Peds  Hematology   Anesthesia Other Findings   Reproductive/Obstetrics                             Anesthesia Physical Anesthesia Plan  ASA: II  Anesthesia Plan: General   Post-op Pain Management:    Induction: Intravenous  PONV Risk Score and Plan: 2 and Ondansetron and Dexamethasone  Airway Management Planned: Oral ETT  Additional Equipment:   Intra-op Plan:   Post-operative Plan: Extubation in OR  Informed Consent: I have reviewed the patients History and Physical, chart, labs and discussed the procedure including the risks, benefits and alternatives for the proposed anesthesia with the patient or authorized representative who has indicated his/her understanding and acceptance.   Dental Advisory Given  Plan Discussed with: CRNA and Surgeon  Anesthesia Plan Comments: (  )        Anesthesia Quick Evaluation

## 2017-02-16 ENCOUNTER — Encounter (HOSPITAL_BASED_OUTPATIENT_CLINIC_OR_DEPARTMENT_OTHER): Payer: Self-pay | Admitting: *Deleted

## 2017-02-16 ENCOUNTER — Ambulatory Visit (HOSPITAL_BASED_OUTPATIENT_CLINIC_OR_DEPARTMENT_OTHER): Payer: BLUE CROSS/BLUE SHIELD | Admitting: Anesthesiology

## 2017-02-16 ENCOUNTER — Encounter (HOSPITAL_BASED_OUTPATIENT_CLINIC_OR_DEPARTMENT_OTHER)
Admission: RE | Disposition: A | Discharge status: Home / Self Care | Payer: Self-pay | Source: Ambulatory Visit | Attending: Obstetrics and Gynecology

## 2017-02-16 ENCOUNTER — Ambulatory Visit (HOSPITAL_BASED_OUTPATIENT_CLINIC_OR_DEPARTMENT_OTHER)
Admission: RE | Admit: 2017-02-16 | Discharge: 2017-02-17 | Disposition: A | Discharge status: Home / Self Care | Payer: BLUE CROSS/BLUE SHIELD | Source: Ambulatory Visit | Attending: Obstetrics and Gynecology | Admitting: Obstetrics and Gynecology

## 2017-02-16 DIAGNOSIS — N816 Rectocele: Secondary | ICD-10-CM | POA: Diagnosis not present

## 2017-02-16 DIAGNOSIS — Z79899 Other long term (current) drug therapy: Secondary | ICD-10-CM | POA: Diagnosis not present

## 2017-02-16 DIAGNOSIS — Z888 Allergy status to other drugs, medicaments and biological substances status: Secondary | ICD-10-CM | POA: Insufficient documentation

## 2017-02-16 DIAGNOSIS — Z85850 Personal history of malignant neoplasm of thyroid: Secondary | ICD-10-CM | POA: Diagnosis not present

## 2017-02-16 DIAGNOSIS — N83202 Unspecified ovarian cyst, left side: Secondary | ICD-10-CM | POA: Diagnosis not present

## 2017-02-16 DIAGNOSIS — N8110 Cystocele, unspecified: Secondary | ICD-10-CM | POA: Diagnosis not present

## 2017-02-16 DIAGNOSIS — E890 Postprocedural hypothyroidism: Secondary | ICD-10-CM | POA: Insufficient documentation

## 2017-02-16 DIAGNOSIS — N83201 Unspecified ovarian cyst, right side: Secondary | ICD-10-CM | POA: Diagnosis not present

## 2017-02-16 DIAGNOSIS — N8189 Other female genital prolapse: Secondary | ICD-10-CM | POA: Diagnosis not present

## 2017-02-16 HISTORY — PX: LAPAROSCOPIC VAGINAL HYSTERECTOMY WITH SALPINGO OOPHORECTOMY: SHX6681

## 2017-02-16 HISTORY — PX: ANTERIOR AND POSTERIOR REPAIR WITH SACROSPINOUS FIXATION: SHX6536

## 2017-02-16 LAB — TYPE AND SCREEN
ABO/RH(D): A POS
ANTIBODY SCREEN: NEGATIVE

## 2017-02-16 SURGERY — HYSTERECTOMY, VAGINAL, LAPAROSCOPY-ASSISTED, WITH SALPINGO-OOPHORECTOMY
Anesthesia: General | Site: Vagina

## 2017-02-16 MED ORDER — DEXAMETHASONE SODIUM PHOSPHATE 10 MG/ML IJ SOLN
INTRAMUSCULAR | Status: AC
  Filled 2017-02-16: qty 1

## 2017-02-16 MED ORDER — SCOPOLAMINE 1 MG/3DAYS TD PT72
MEDICATED_PATCH | TRANSDERMAL | Status: AC
  Filled 2017-02-16: qty 1

## 2017-02-16 MED ORDER — LIDOCAINE-EPINEPHRINE (PF) 1 %-1:200000 IJ SOLN
INTRAMUSCULAR | Status: DC | PRN
  Administered 2017-02-16: 3 mL

## 2017-02-16 MED ORDER — ONDANSETRON HCL 4 MG/2ML IJ SOLN
INTRAMUSCULAR | Status: AC
  Filled 2017-02-16: qty 2

## 2017-02-16 MED ORDER — SUGAMMADEX SODIUM 200 MG/2ML IV SOLN
INTRAVENOUS | Status: AC
  Filled 2017-02-16: qty 2

## 2017-02-16 MED ORDER — DEXAMETHASONE SODIUM PHOSPHATE 10 MG/ML IJ SOLN
INTRAMUSCULAR | Status: DC | PRN
  Administered 2017-02-16: 10 mg via INTRAVENOUS

## 2017-02-16 MED ORDER — FENTANYL CITRATE (PF) 100 MCG/2ML IJ SOLN
INTRAMUSCULAR | Status: AC
  Filled 2017-02-16: qty 2

## 2017-02-16 MED ORDER — EPHEDRINE 5 MG/ML INJ
INTRAVENOUS | Status: AC
  Filled 2017-02-16: qty 10

## 2017-02-16 MED ORDER — OXYCODONE-ACETAMINOPHEN 5-325 MG PO TABS
1.0000 | ORAL_TABLET | ORAL | Status: DC | PRN
  Administered 2017-02-16 – 2017-02-17 (×4): 1 via ORAL
  Filled 2017-02-16: qty 2

## 2017-02-16 MED ORDER — LACTATED RINGERS IV SOLN
INTRAVENOUS | Status: DC
  Administered 2017-02-16 (×3): via INTRAVENOUS
  Filled 2017-02-16: qty 1000

## 2017-02-16 MED ORDER — BUPIVACAINE HCL (PF) 0.25 % IJ SOLN
INTRAMUSCULAR | Status: DC | PRN
  Administered 2017-02-16: 4 mL

## 2017-02-16 MED ORDER — CEFAZOLIN SODIUM-DEXTROSE 2-4 GM/100ML-% IV SOLN
INTRAVENOUS | Status: AC
  Filled 2017-02-16: qty 100

## 2017-02-16 MED ORDER — METOCLOPRAMIDE HCL 5 MG/ML IJ SOLN
INTRAMUSCULAR | Status: DC | PRN
  Administered 2017-02-16: 5 mg via INTRAVENOUS

## 2017-02-16 MED ORDER — DIPHENHYDRAMINE HCL 50 MG/ML IJ SOLN
12.5000 mg | Freq: Four times a day (QID) | INTRAMUSCULAR | Status: DC | PRN
  Filled 2017-02-16: qty 0.25

## 2017-02-16 MED ORDER — OXYCODONE-ACETAMINOPHEN 5-325 MG PO TABS
ORAL_TABLET | ORAL | Status: AC
  Filled 2017-02-16: qty 1

## 2017-02-16 MED ORDER — ROCURONIUM BROMIDE 50 MG/5ML IV SOSY
PREFILLED_SYRINGE | INTRAVENOUS | Status: AC
  Filled 2017-02-16: qty 5

## 2017-02-16 MED ORDER — PROPOFOL 10 MG/ML IV BOLUS
INTRAVENOUS | Status: DC | PRN
Start: 2017-02-16 — End: 2017-02-16
  Administered 2017-02-16: 150 mg via INTRAVENOUS

## 2017-02-16 MED ORDER — ONDANSETRON HCL 4 MG/2ML IJ SOLN
4.0000 mg | Freq: Four times a day (QID) | INTRAMUSCULAR | Status: DC | PRN
  Administered 2017-02-16: 4 mg via INTRAVENOUS
  Filled 2017-02-16: qty 2

## 2017-02-16 MED ORDER — FENTANYL CITRATE (PF) 100 MCG/2ML IJ SOLN
INTRAMUSCULAR | Status: DC | PRN
  Administered 2017-02-16 (×4): 50 ug via INTRAVENOUS

## 2017-02-16 MED ORDER — CEFAZOLIN SODIUM-DEXTROSE 2-4 GM/100ML-% IV SOLN
2.0000 g | INTRAVENOUS | Status: AC
  Administered 2017-02-16: 2 g via INTRAVENOUS
  Filled 2017-02-16: qty 100

## 2017-02-16 MED ORDER — ROCURONIUM BROMIDE 10 MG/ML (PF) SYRINGE
PREFILLED_SYRINGE | INTRAVENOUS | Status: DC | PRN
  Administered 2017-02-16 (×2): 10 mg via INTRAVENOUS
  Administered 2017-02-16: 50 mg via INTRAVENOUS

## 2017-02-16 MED ORDER — MIDAZOLAM HCL 2 MG/2ML IJ SOLN
INTRAMUSCULAR | Status: DC | PRN
  Administered 2017-02-16: 2 mg via INTRAVENOUS

## 2017-02-16 MED ORDER — MIDAZOLAM HCL 2 MG/2ML IJ SOLN
INTRAMUSCULAR | Status: AC
  Filled 2017-02-16: qty 2

## 2017-02-16 MED ORDER — FENTANYL CITRATE (PF) 100 MCG/2ML IJ SOLN
25.0000 ug | INTRAMUSCULAR | Status: DC | PRN
  Administered 2017-02-16 (×4): 50 ug via INTRAVENOUS
  Filled 2017-02-16: qty 1

## 2017-02-16 MED ORDER — HYDROMORPHONE 1 MG/ML IV SOLN
INTRAVENOUS | Status: DC
  Administered 2017-02-16: 25 mg via INTRAVENOUS
  Administered 2017-02-16: 1.9 mg via INTRAVENOUS
  Filled 2017-02-16 (×2): qty 25

## 2017-02-16 MED ORDER — LIDOCAINE 2% (20 MG/ML) 5 ML SYRINGE
INTRAMUSCULAR | Status: AC
  Filled 2017-02-16: qty 10

## 2017-02-16 MED ORDER — SODIUM CHLORIDE 0.9 % IR SOLN
Status: DC | PRN
  Administered 2017-02-16: 3000 mL via INTRAVESICAL

## 2017-02-16 MED ORDER — ONDANSETRON HCL 4 MG/2ML IJ SOLN
4.0000 mg | Freq: Four times a day (QID) | INTRAMUSCULAR | Status: DC | PRN
  Filled 2017-02-16: qty 2

## 2017-02-16 MED ORDER — KETOROLAC TROMETHAMINE 30 MG/ML IJ SOLN
INTRAMUSCULAR | Status: DC | PRN
  Administered 2017-02-16: 30 mg via INTRAMUSCULAR
  Administered 2017-02-16: 30 mg via INTRAVENOUS

## 2017-02-16 MED ORDER — SODIUM CHLORIDE 0.9% FLUSH
9.0000 mL | INTRAVENOUS | Status: DC | PRN
  Filled 2017-02-16: qty 10

## 2017-02-16 MED ORDER — ONDANSETRON HCL 4 MG PO TABS
4.0000 mg | ORAL_TABLET | Freq: Four times a day (QID) | ORAL | Status: DC | PRN
  Filled 2017-02-16: qty 1

## 2017-02-16 MED ORDER — LACTATED RINGERS IR SOLN
Status: DC | PRN
  Administered 2017-02-16: 3000 mL

## 2017-02-16 MED ORDER — ACETAMINOPHEN 325 MG PO TABS
650.0000 mg | ORAL_TABLET | ORAL | Status: DC | PRN
  Administered 2017-02-17: 325 mg via ORAL
  Filled 2017-02-16: qty 2

## 2017-02-16 MED ORDER — DIPHENHYDRAMINE HCL 12.5 MG/5ML PO ELIX
12.5000 mg | ORAL_SOLUTION | Freq: Four times a day (QID) | ORAL | Status: DC | PRN
  Filled 2017-02-16: qty 5

## 2017-02-16 MED ORDER — SUGAMMADEX SODIUM 200 MG/2ML IV SOLN
INTRAVENOUS | Status: DC | PRN
  Administered 2017-02-16: 150 mg via INTRAVENOUS

## 2017-02-16 MED ORDER — NALOXONE HCL 0.4 MG/ML IJ SOLN
0.4000 mg | INTRAMUSCULAR | Status: DC | PRN
  Filled 2017-02-16: qty 1

## 2017-02-16 MED ORDER — KETOROLAC TROMETHAMINE 30 MG/ML IJ SOLN
INTRAMUSCULAR | Status: AC
  Filled 2017-02-16: qty 1

## 2017-02-16 MED ORDER — METOCLOPRAMIDE HCL 5 MG/ML IJ SOLN
INTRAMUSCULAR | Status: AC
  Filled 2017-02-16: qty 2

## 2017-02-16 MED ORDER — MENTHOL 3 MG MT LOZG
1.0000 | LOZENGE | OROMUCOSAL | Status: DC | PRN
  Filled 2017-02-16: qty 9

## 2017-02-16 MED ORDER — ARTIFICIAL TEARS OPHTHALMIC OINT
TOPICAL_OINTMENT | OPHTHALMIC | Status: AC
  Filled 2017-02-16: qty 3.5

## 2017-02-16 MED ORDER — SCOPOLAMINE 1 MG/3DAYS TD PT72
MEDICATED_PATCH | TRANSDERMAL | Status: DC | PRN
  Administered 2017-02-16: 1 via TRANSDERMAL

## 2017-02-16 MED ORDER — ONDANSETRON HCL 4 MG/2ML IJ SOLN
INTRAMUSCULAR | Status: DC | PRN
  Administered 2017-02-16: 4 mg via INTRAVENOUS

## 2017-02-16 MED ORDER — EPHEDRINE SULFATE-NACL 50-0.9 MG/10ML-% IV SOSY
PREFILLED_SYRINGE | INTRAVENOUS | Status: DC | PRN
  Administered 2017-02-16: 15 mg via INTRAVENOUS
  Administered 2017-02-16: 10 mg via INTRAVENOUS

## 2017-02-16 MED ORDER — PROPOFOL 10 MG/ML IV BOLUS
INTRAVENOUS | Status: AC
  Filled 2017-02-16: qty 40

## 2017-02-16 MED ORDER — LIDOCAINE 2% (20 MG/ML) 5 ML SYRINGE
INTRAMUSCULAR | Status: DC | PRN
  Administered 2017-02-16: 100 mg via INTRAVENOUS

## 2017-02-16 MED ORDER — LACTATED RINGERS IV SOLN
INTRAVENOUS | Status: DC
  Administered 2017-02-16: 19:00:00 via INTRAVENOUS
  Filled 2017-02-16 (×2): qty 1000

## 2017-02-16 SURGICAL SUPPLY — 73 items
BLADE SURG 15 STRL LF DISP TIS (BLADE) ×2 IMPLANT
BLADE SURG 15 STRL SS (BLADE) ×2
BNDG GAUZE ELAST 4 BULKY (GAUZE/BANDAGES/DRESSINGS) ×4 IMPLANT
CANISTER SUCT 3000ML PPV (MISCELLANEOUS) ×8 IMPLANT
CATH ROBINSON RED A/P 16FR (CATHETERS) ×4 IMPLANT
COVER BACK TABLE 60X90IN (DRAPES) ×4 IMPLANT
COVER MAYO STAND STRL (DRAPES) ×8 IMPLANT
DERMABOND ADVANCED (GAUZE/BANDAGES/DRESSINGS) ×2
DERMABOND ADVANCED .7 DNX12 (GAUZE/BANDAGES/DRESSINGS) ×2 IMPLANT
DEVICE CAPIO SLIM SINGLE (INSTRUMENTS) ×4 IMPLANT
DRSG COVADERM PLUS 2X2 (GAUZE/BANDAGES/DRESSINGS) ×4 IMPLANT
DRSG OPSITE POSTOP 3X4 (GAUZE/BANDAGES/DRESSINGS) ×4 IMPLANT
ELECT REM PT RETURN 9FT ADLT (ELECTROSURGICAL) ×4
ELECTRODE REM PT RTRN 9FT ADLT (ELECTROSURGICAL) ×2 IMPLANT
GLOVE BIO SURGEON STRL SZ7 (GLOVE) ×16 IMPLANT
GLOVE BIOGEL PI IND STRL 6.5 (GLOVE) ×2 IMPLANT
GLOVE BIOGEL PI INDICATOR 6.5 (GLOVE) ×2
GLOVE ECLIPSE 6.5 STRL STRAW (GLOVE) ×4 IMPLANT
GOWN SPEC L4 XLG W/TWL (GOWN DISPOSABLE) ×4 IMPLANT
HOLDER FOLEY CATH W/STRAP (MISCELLANEOUS) ×4 IMPLANT
IV LACTATED RINGER IRRG 3000ML (IV SOLUTION) ×2
IV LR IRRIG 3000ML ARTHROMATIC (IV SOLUTION) ×2 IMPLANT
IV NS IRRIG 3000ML ARTHROMATIC (IV SOLUTION) ×4 IMPLANT
KIT RM TURNOVER CYSTO AR (KITS) ×4 IMPLANT
LEGGING LITHOTOMY PAIR STRL (DRAPES) ×4 IMPLANT
NEEDLE HYPO 22GX1.5 SAFETY (NEEDLE) ×4 IMPLANT
NEEDLE INSUFFLATION 14GA 120MM (NEEDLE) ×4 IMPLANT
NEEDLE MAYO 6 CRC TAPER PT (NEEDLE) ×4 IMPLANT
NS IRRIG 500ML POUR BTL (IV SOLUTION) ×4 IMPLANT
PACK LAVH (CUSTOM PROCEDURE TRAY) ×4 IMPLANT
PACK ROBOTIC GOWN (GOWN DISPOSABLE) ×4 IMPLANT
PACK VAGINAL WOMENS (CUSTOM PROCEDURE TRAY) ×4 IMPLANT
PAD OB MATERNITY 4.3X12.25 (PERSONAL CARE ITEMS) ×4 IMPLANT
PAD POSITIONING PINK XL (MISCELLANEOUS) ×4 IMPLANT
PAD PREP 24X48 CUFFED NSTRL (MISCELLANEOUS) ×4 IMPLANT
POUCH SPECIMEN RETRIEVAL 10MM (ENDOMECHANICALS) IMPLANT
SCISSORS LAP 5X35 DISP (ENDOMECHANICALS) IMPLANT
SCISSORS LAP 5X45 EPIX DISP (ENDOMECHANICALS) IMPLANT
SEALER TISSUE G2 CVD JAW 45CM (ENDOMECHANICALS) ×4 IMPLANT
SET IRRIG TUBING LAPAROSCOPIC (IRRIGATION / IRRIGATOR) ×4 IMPLANT
SET IRRIG Y TYPE TUR BLADDER L (SET/KITS/TRAYS/PACK) ×4 IMPLANT
SOLUTION ELECTROLUBE (MISCELLANEOUS) IMPLANT
SUT CAPIO POLYGLYCOLIC (SUTURE) ×4 IMPLANT
SUT CHROMIC 2 0 SH (SUTURE) ×4 IMPLANT
SUT CHROMIC 3 0 SH 27 (SUTURE) ×4 IMPLANT
SUT GUT CHROMIC 3 0 (SUTURE) IMPLANT
SUT MNCRL AB 4-0 PS2 18 (SUTURE) ×4 IMPLANT
SUT MON AB 2-0 CT1 36 (SUTURE) ×4 IMPLANT
SUT MON AB 3-0 SH 27 (SUTURE)
SUT MON AB 3-0 SH27 (SUTURE) IMPLANT
SUT VIC AB 0 CT1 18XCR BRD8 (SUTURE) ×6 IMPLANT
SUT VIC AB 0 CT1 36 (SUTURE) ×8 IMPLANT
SUT VIC AB 0 CT1 8-18 (SUTURE) ×6
SUT VIC AB 2-0 CT1 (SUTURE) IMPLANT
SUT VIC AB 2-0 CT1 27 (SUTURE)
SUT VIC AB 2-0 CT1 TAPERPNT 27 (SUTURE) IMPLANT
SUT VIC AB 2-0 CT2 27 (SUTURE) IMPLANT
SUT VIC AB 2-0 SH 18 (SUTURE) IMPLANT
SUT VIC AB 2-0 SH 27 (SUTURE)
SUT VIC AB 2-0 SH 27XBRD (SUTURE) IMPLANT
SUT VIC AB 2-0 UR6 27 (SUTURE) ×16 IMPLANT
SUT VIC AB 3-0 SH 27 (SUTURE)
SUT VIC AB 3-0 SH 27X BRD (SUTURE) IMPLANT
SUT VICRYL 0 UR6 27IN ABS (SUTURE) IMPLANT
SUT VICRYL 1 TIES 12X18 (SUTURE) ×4 IMPLANT
SUT VICRYL 4-0 PS2 18IN ABS (SUTURE) ×4 IMPLANT
TOWEL OR 17X24 6PK STRL BLUE (TOWEL DISPOSABLE) ×8 IMPLANT
TRAY FOLEY BAG SILVER LF 14FR (CATHETERS) ×4 IMPLANT
TRAY FOLEY CATH SILVER 14FR (SET/KITS/TRAYS/PACK) ×4 IMPLANT
TROCAR OPTI TIP 5M 100M (ENDOMECHANICALS) ×4 IMPLANT
TROCAR XCEL DIL TIP R 11M (ENDOMECHANICALS) ×4 IMPLANT
TUBING INSUF HEATED (TUBING) ×4 IMPLANT
WARMER LAPAROSCOPE (MISCELLANEOUS) ×4 IMPLANT

## 2017-02-16 NOTE — H&P (Signed)
  History and physical exam unchanged 

## 2017-02-16 NOTE — Anesthesia Procedure Notes (Signed)
Procedure Name: Intubation Date/Time: 02/16/2017 7:28 AM Performed by: Lyndle Herrlich Pre-anesthesia Checklist: Patient identified, Emergency Drugs available, Suction available and Patient being monitored Patient Re-evaluated:Patient Re-evaluated prior to induction Oxygen Delivery Method: Circle system utilized Preoxygenation: Pre-oxygenation with 100% oxygen Induction Type: IV induction Ventilation: Mask ventilation without difficulty Laryngoscope Size: Mac Grade View: Grade I Tube type: Oral Tube size: 7.0 mm Number of attempts: 1 Airway Equipment and Method: Stylet and LTA kit utilized Placement Confirmation: ETT inserted through vocal cords under direct vision,  positive ETCO2 and breath sounds checked- equal and bilateral Secured at: 21 cm Tube secured with: Tape Dental Injury: Teeth and Oropharynx as per pre-operative assessment

## 2017-02-16 NOTE — Brief Op Note (Signed)
Patient name Allison [name], Allison [name] DICTATION# [id number] CSN# [id number]  [facility], MD 02/16/2017 9:35 AM

## 2017-02-16 NOTE — Progress Notes (Signed)
Patient ID: Allison [name], female   DOB: 10-09-1955, 61 y.o.   MRN: [id number] AF VSS ABD SOFT GOOD UO INC CLEAR

## 2017-02-16 NOTE — Transfer of Care (Signed)
  Vitals:   02/16/17 0558 02/16/17 0936  BP: 105/66   Pulse: 65 87  Resp: 16 11  Temp: 36.9 C 36.5 C  SpO2: 97% 98%    Last Pain:  Vitals:   02/16/17 0558  TempSrc: Oral      Patients Stated Pain Goal: 8 (02/16/17 0630)  Immediate Anesthesia Transfer of Care Note  Patient: Allison [name]  Procedure(s) Performed: Procedure(s) (LRB): LAPAROSCOPIC ASSISTED VAGINAL HYSTERECTOMY WITH SALPINGO OOPHORECTOMY (Bilateral) ANTERIOR AND POSTERIOR REPAIR WITH SACROSPINOUS FIXATION (N/A)  Patient Location: PACU  Anesthesia Type: General  Level of Consciousness: awake, alert  and oriented  Airway & Oxygen Therapy: Patient Spontanous Breathing and Patient connected to nasal cannula oxygen  Post-op Assessment: Report given to PACU RN and Post -op Vital signs reviewed and stable  Post vital signs: Reviewed and stable  Complications: No apparent anesthesia complications

## 2017-02-16 NOTE — Progress Notes (Signed)
PCA dilaudid dc'd. Wasted 25ml, witnessed by D. Rolena Infante, Therapist, sports.  Blair Hailey, RN

## 2017-02-17 DIAGNOSIS — N8189 Other female genital prolapse: Secondary | ICD-10-CM | POA: Diagnosis not present

## 2017-02-17 LAB — CBC
HEMATOCRIT: 37.9 % (ref 36.0–46.0)
Hemoglobin: 12.6 g/dL (ref 12.0–15.0)
MCH: 30.7 pg (ref 26.0–34.0)
MCHC: 33.2 g/dL (ref 30.0–36.0)
MCV: 92.2 fL (ref 78.0–100.0)
Platelets: 225 10*3/uL (ref 150–400)
RBC: 4.11 MIL/uL (ref 3.87–5.11)
RDW: 13.9 % (ref 11.5–15.5)
WBC: 8.6 10*3/uL (ref 4.0–10.5)

## 2017-02-17 MED ORDER — OXYCODONE-ACETAMINOPHEN 7.5-325 MG PO TABS: 1.0000 | ORAL_TABLET | ORAL | 0 refills | Status: AC | PRN

## 2017-02-17 MED ORDER — OXYCODONE-ACETAMINOPHEN 5-325 MG PO TABS
ORAL_TABLET | ORAL | Status: AC
  Filled 2017-02-17: qty 1

## 2017-02-17 MED ORDER — ACETAMINOPHEN 325 MG PO TABS
ORAL_TABLET | ORAL | Status: AC
  Filled 2017-02-17: qty 1

## 2017-02-17 NOTE — Op Note (Signed)
Allison [name], Allison [name]           ACCOUNT NO.:  [id number]  MEDICAL RECORD NO.:  [id number]  LOCATION:                                 FACILITY:  PHYSICIAN:  Darlyn Chamber, M.D.        DATE OF BIRTH:  DATE OF PROCEDURE:  02/16/2017 DATE OF DISCHARGE:                              OPERATIVE REPORT   PREOPERATIVE DIAGNOSIS:  Symptomatic pelvic relaxation.  POSTOPERATIVE DIAGNOSIS:  Symptomatic pelvic relaxation.  OPERATIVE PROCEDURES: 1. Laparoscopic-assisted vaginal hysterectomy with removal of both     ovaries and tubes. 2. Anterior and posterior colporrhaphy. 3. Sacrospinous ligament suspension. 4. Cystoscopy.  SURGEON:  Darlyn Chamber, M.D.  ASSISTANT:  Ralene Bathe. Matthew Saras, M.D.  ANESTHESIA:  General endotracheal.  ESTIMATED BLOOD LOSS:  300 to 400 mL.  PACKS:  Included a vaginal pack.  DRAINS:  Included a urethral Foley.  INTRAOPERATIVE BLOOD REPLACEMENT:  None.  COMPLICATIONS:  None.  INDICATIONS:  Dictated in history and physical.  DESCRIPTION OF PROCEDURE:  The patient was taken to the OR, placed in supine position.  After satisfactory level of general anesthesia was obtained, the patient was placed in the dorsal lithotomy supine position using the Allen stirrups.  At this point in time, perineum and vagina were prepped out with Betadine.  Abdomen was prepped out with DuraPrep. Bladder was then emptied by in-and-out catheterization.  A Hulka tenaculum was put in place and secured.  At this point in time, a subumbilical incision made with a knife.  The Veress needle was inserted without difficulty and insufflated with carbon dioxide.  The laparoscopic trocar was then put in place.  Visualization revealed no evidence of injury to adjacent organs.  A 5-mm trocar was put in place in suprapubic area under direct visualization.  Visualization revealed no evidence of injury to adjacent organs.  Uterus was normal size and shape.  Tubes and ovaries were  unremarkable.  Both lateral gutters were clear including the appendix and upper abdomen were clear.  At this point in time, the EnSeal was brought in place.  We first went to the right side.  The right tube and ovary were elevated.  Ureter was easily identified in its course in the pelvis.  Using the EnSeal, the ovarian vasculature was cauterized and incised.  The mesenteric attachments of the ovary were then cauterized and incised up to the uterus.  At this point in time, the round ligaments were cauterized and incised.  I then went to the left side.  The left tube and ovary elevated.  Ureter was easily identified along the pelvic sidewall.  The left ovarian vasculature was cauterized and incised.  The mesenteric attachments of the tube and ovary were cauterized and incised up to the uterus and the left round ligament was cauterized and incised.  At this point in time, we had good hemostasis.  The decision was then to go vaginally.  The patient's legs were repositioned.  Laparoscope had been removed. Abdomen was desufflated with carbon dioxide.  At this point in time, the speculum was placed in the vaginal vault.  The cervix was grasped with Ardis Hughs tenaculum.  Cul-de-sac was entered sharply.  Both uterosacral ligaments were clamped,  cut, suture ligated with 0 Vicryl.  Reflection of the vaginal mucosa anteriorly was incised and bladder was dissected superiorly.  Using a clamp, cut, and tie technique with suture ligatures of 0 Vicryl, the parametrium was serially separated from sides of uterus.  Vesicouterine space was entered sharply.  At this point in time, the uterus was flipped.  Remaining pedicles were clamped and cut. Uterus, tubes, and ovaries were passed off the operative field.  Held pedicles were secured with free tie of 0 Vicryl.  At this point in time, the posterior vaginal cuff was run with a running locked suture of 0 Vicryl.  Uterosacral plication was put in place  and secured.  Areas of bleeding were brought under control with figure-of- eights of 0 Vicryl.  Attention now turned to the anterior repair.  The vaginal mucosa was undermined sharply and incised in the midline.  Next, using blunt and sharp dissection, the perivascular tissue was separated from the overlying vaginal mucosa.  At this point in time, we reapproximated the paracervical fascia in the midline with interrupted sutures of 2-0 Vicryl.  We had good reduction of the cystocele.  The vaginal mucosa was then trimmed.  Then, the vaginal mucosa was reapproximated with interrupted sutures of 2-0 Vicryl.  The vaginal cuff was then closed with interrupted sutures of 2-0 Vicryl.  At this point in time, the perineum was infiltrated with 1% xylocaine with epinephrine.  The vaginal mucosa was also infiltrated.  The incision was made over the perineal body and excised up to the vaginal opening.  The vaginal mucosa was then undermined using sharp and blunt dissection up to near the vaginal apex.  Next, the perirectal fascia was taken off the overlying vaginal mucosa using sharp and blunt dissection. Next, we brought in the Capio needle.  A suture of 0 Vicryl was placed through the right sacrospinous ligament.  It was secured to the top of the vaginal mucosa and held.  The rectocele was then reduced by suturing in the midline of the perirectal fascia with interrupted sutures of 0 Vicryl.  We had good reduction of the rectocele.  We then began reapproximating the vaginal mucosa with interrupted sutures of 0 Vicryl. We then tied down the sacrospinous ligament suspension with a good elevation of the vaginal cuff.  The remaining vaginal mucosa was closed with interrupted sutures of 2-0 Vicryl.  Perineal body was rebuilt with 2-0 Vicryl.  At this point in time, the patient's legs were repositioned. Laparoscopy was then performed again.  Areas of oozing brought under control with the EnSeal.  We  had good hemostasis.  We thoroughly irrigated the pelvis.  We had good hemostasis.  No evidence of injury to adjacent organs.  At this point in time, all trocars were removed.  The subumbilical incision was then closed with subcuticulars of 4-0 Monocryl.  The suprapubic incision was closed with Dermabond.  At this point in time, we did perform cystoscopy.  There was no evidence of bladder injury.  Both ureteral orifices were visualized and noted to be spilling jets of clear urine.  At this point in time, a Foley was placed to straight drain.  A vaginal pack was put in place.  The patient was taken out of the dorsal lithotomy position.  Once alert and extubated, transferred to the recovery room in good condition.  Sponge, instrument, and needle count was reported as correct by circulating nurse x2.     Darlyn Chamber, M.D.  JSM/MEDQ  D:  02/16/2017  T:  02/17/2017  Job:  [id number]

## 2017-02-17 NOTE — Discharge Summary (Signed)
Patient name Allison [name]. Asianae DICTATION# [id number] CSN# [id number]  Darlyn Chamber, MD 02/17/2017 7:21 AM

## 2017-02-17 NOTE — Discharge Summary (Signed)
NAMEPHYLLISS, STREGE           ACCOUNT NO.:  [id number]  MEDICAL RECORD NO.:  [id number]  LOCATION:                                 FACILITY:  PHYSICIAN:  Darlyn Chamber, M.D.        DATE OF BIRTH:  DATE OF ADMISSION:  02/16/2017 DATE OF DISCHARGE:  02/17/2017                              DISCHARGE SUMMARY   ADMITTING DIAGNOSIS:  Pelvic relaxation.  DISCHARGE DIAGNOSIS:  Pelvic relaxation.  OPERATIVE PROCEDURES:  Laparoscopic-assisted vaginal hysterectomy with removal of both tubes and ovaries.  Anterior and posterior repair and sacrospinous ligament suspension along with cystoscopy.  In terms of history and physical, please see dictated note.  COURSE IN THE HOSPITAL:  The patient underwent above-noted surgery.  Did well postoperatively.  The following day, she was afebrile.  Vital signs were stable.  Her abdomen was soft.  Bowel sounds were active.  All incisions were clear.  She still had vaginal packing in.  She had good urine output through the night.  Her postop hemoglobin was 12.6.  White count 8600.  At this point in time, we will discontinue the vaginal packing fully.  When she has had a chance to empty her bladder, she will be discharged home.  In terms of complications, none were encountered during her stay in the hospital.  The patient was discharged home in stable condition.  DISPOSITION:  The patient is to avoid heavy lifting or vaginal entrance. She should be careful driving car.  She was instructed to call should there be any signs of infection in terms of fever.  Nausea, vomiting should be reported.  Excessive pain unresponsive to pain medication should be reported.  Heavy vaginal bleeding should be reported. Instructed signs and symptoms of deep venous thrombosis and pulmonary embolus.  Discharged home on Percocet she needs for pain.  Office will call her tomorrow to arrange followup.     Darlyn Chamber, M.D.     JSM/MEDQ  D:  02/17/2017  T:   02/17/2017  Job:  [id number]

## 2017-02-17 NOTE — Progress Notes (Signed)
1 Day Post-Op Procedure(s) (LRB): LAPAROSCOPIC ASSISTED VAGINAL HYSTERECTOMY WITH SALPINGO OOPHORECTOMY (Bilateral) ANTERIOR AND POSTERIOR REPAIR WITH SACROSPINOUS FIXATION (N/A)  Subjective: Patient reports tolerating PO.    Objective: I have reviewed patient's vital signs, intake and output and labs.  General: alert GI: soft, non-tender; bowel sounds normal; no masses,  no organomegaly Vaginal Bleeding: none  Assessment: s/p Procedure(s): LAPAROSCOPIC ASSISTED VAGINAL HYSTERECTOMY WITH SALPINGO OOPHORECTOMY (Bilateral) ANTERIOR AND POSTERIOR REPAIR WITH SACROSPINOUS FIXATION (N/A): stable  Plan: Discharge home  LOS: 0 days    Norman Bier S 02/17/2017, 7:17 AM

## 2017-02-17 NOTE — Anesthesia Postprocedure Evaluation (Signed)
Anesthesia Post Note  Patient: LYSSA HACKLEY  Procedure(s) Performed: LAPAROSCOPIC ASSISTED VAGINAL HYSTERECTOMY WITH SALPINGO OOPHORECTOMY (Bilateral Abdomen) ANTERIOR AND POSTERIOR REPAIR WITH SACROSPINOUS FIXATION (N/A Vagina )     Patient location during evaluation: PACU Anesthesia Type: General Level of consciousness: awake and alert Pain management: pain level controlled Vital Signs Assessment: post-procedure vital signs reviewed and stable Respiratory status: spontaneous breathing, nonlabored ventilation, respiratory function stable and patient connected to nasal cannula oxygen Cardiovascular status: blood pressure returned to baseline and stable Postop Assessment: no apparent nausea or vomiting Anesthetic complications: no    Last Vitals:  Vitals:   02/17/17 0634 02/17/17 0745  BP: (!) 104/48 (!) 105/53  Pulse: (!) 56 (!) 45  Resp: 16 16  Temp: 37.3 C 37.4 C  SpO2: 96% 96%    Last Pain:  Vitals:   02/17/17 0924  TempSrc:   PainSc: 2                  Dnaiel Voller EDWARD

## 2017-02-17 NOTE — Discharge Instructions (Signed)
HOME CARE INSTRUCTIONS FOR VAGINAL SLING  Activity:  -No lifting greater than 10-15 pounds for 1 week, or as instructed by your physician.             -No sexual intercourse until your f/u visit Diet:  You may return to your normal diet tomorrow. It is important to keep your bowels regular during the postoperative period. To avoid constipation, drink plenty of fluids during the day (8-10 glasses) and eat plenty of fresh fruits and vegetables.  Use a mild laxative or stool softener if necessary.  Wound Care:  You may begin showering tomorrow, or as instructed by your physician.      Return to Work as instructed by your physician.    Special Instructions:   Call your physician if any of these symptoms occur:   -temperature greater than 101 degrees Farenheit.   -redness, swelling or drainage at incision site.   -foul odor of your urine.   -a significant decrease in the amount of urine you have every day.   -severe pain not relieved by your pain medication.

## 2017-02-18 ENCOUNTER — Encounter (HOSPITAL_BASED_OUTPATIENT_CLINIC_OR_DEPARTMENT_OTHER): Payer: Self-pay | Admitting: Obstetrics and Gynecology

## 2018-02-15 DIAGNOSIS — Z13220 Encounter for screening for lipoid disorders: Secondary | ICD-10-CM | POA: Diagnosis not present

## 2018-02-15 DIAGNOSIS — Z1329 Encounter for screening for other suspected endocrine disorder: Secondary | ICD-10-CM | POA: Diagnosis not present

## 2018-02-15 DIAGNOSIS — I10 Essential (primary) hypertension: Secondary | ICD-10-CM | POA: Diagnosis not present

## 2018-02-15 DIAGNOSIS — Z13228 Encounter for screening for other metabolic disorders: Secondary | ICD-10-CM | POA: Diagnosis not present

## 2018-06-29 DIAGNOSIS — H524 Presbyopia: Secondary | ICD-10-CM | POA: Diagnosis not present

## 2018-09-14 DIAGNOSIS — L723 Sebaceous cyst: Secondary | ICD-10-CM | POA: Diagnosis not present

## 2018-09-14 DIAGNOSIS — L281 Prurigo nodularis: Secondary | ICD-10-CM | POA: Diagnosis not present

## 2018-09-14 DIAGNOSIS — L821 Other seborrheic keratosis: Secondary | ICD-10-CM | POA: Diagnosis not present

## 2018-10-01 DIAGNOSIS — E890 Postprocedural hypothyroidism: Secondary | ICD-10-CM | POA: Diagnosis not present

## 2018-10-05 DIAGNOSIS — Z85850 Personal history of malignant neoplasm of thyroid: Secondary | ICD-10-CM | POA: Diagnosis not present

## 2018-10-05 DIAGNOSIS — E890 Postprocedural hypothyroidism: Secondary | ICD-10-CM | POA: Diagnosis not present

## 2018-10-25 DIAGNOSIS — M7541 Impingement syndrome of right shoulder: Secondary | ICD-10-CM | POA: Diagnosis not present

## 2018-10-25 DIAGNOSIS — M25511 Pain in right shoulder: Secondary | ICD-10-CM | POA: Diagnosis not present

## 2019-02-15 DIAGNOSIS — Z6825 Body mass index (BMI) 25.0-25.9, adult: Secondary | ICD-10-CM | POA: Diagnosis not present

## 2019-02-15 DIAGNOSIS — M7541 Impingement syndrome of right shoulder: Secondary | ICD-10-CM | POA: Diagnosis not present

## 2019-02-15 DIAGNOSIS — Z01419 Encounter for gynecological examination (general) (routine) without abnormal findings: Secondary | ICD-10-CM | POA: Diagnosis not present

## 2019-02-15 DIAGNOSIS — Z23 Encounter for immunization: Secondary | ICD-10-CM | POA: Diagnosis not present

## 2019-02-15 DIAGNOSIS — Z1231 Encounter for screening mammogram for malignant neoplasm of breast: Secondary | ICD-10-CM | POA: Diagnosis not present

## 2019-02-15 DIAGNOSIS — M25511 Pain in right shoulder: Secondary | ICD-10-CM | POA: Diagnosis not present

## 2019-02-15 DIAGNOSIS — M7551 Bursitis of right shoulder: Secondary | ICD-10-CM | POA: Diagnosis not present

## 2019-03-07 DIAGNOSIS — M25511 Pain in right shoulder: Secondary | ICD-10-CM | POA: Diagnosis not present

## 2019-03-14 DIAGNOSIS — M7541 Impingement syndrome of right shoulder: Secondary | ICD-10-CM | POA: Diagnosis not present

## 2019-03-14 DIAGNOSIS — M7551 Bursitis of right shoulder: Secondary | ICD-10-CM | POA: Diagnosis not present

## 2019-04-11 DIAGNOSIS — M7551 Bursitis of right shoulder: Secondary | ICD-10-CM | POA: Diagnosis not present

## 2019-04-11 DIAGNOSIS — M7541 Impingement syndrome of right shoulder: Secondary | ICD-10-CM | POA: Diagnosis not present

## 2019-04-26 DIAGNOSIS — M94211 Chondromalacia, right shoulder: Secondary | ICD-10-CM | POA: Diagnosis not present

## 2019-04-26 DIAGNOSIS — M24111 Other articular cartilage disorders, right shoulder: Secondary | ICD-10-CM | POA: Diagnosis not present

## 2019-04-26 DIAGNOSIS — M19011 Primary osteoarthritis, right shoulder: Secondary | ICD-10-CM | POA: Diagnosis not present

## 2019-04-26 DIAGNOSIS — Z4889 Encounter for other specified surgical aftercare: Secondary | ICD-10-CM | POA: Diagnosis not present

## 2019-04-26 DIAGNOSIS — M65811 Other synovitis and tenosynovitis, right shoulder: Secondary | ICD-10-CM | POA: Diagnosis not present

## 2019-04-26 DIAGNOSIS — M7501 Adhesive capsulitis of right shoulder: Secondary | ICD-10-CM | POA: Diagnosis not present

## 2019-04-26 DIAGNOSIS — G8918 Other acute postprocedural pain: Secondary | ICD-10-CM | POA: Diagnosis not present

## 2019-04-26 DIAGNOSIS — M25511 Pain in right shoulder: Secondary | ICD-10-CM | POA: Diagnosis not present

## 2019-04-26 DIAGNOSIS — M75111 Incomplete rotator cuff tear or rupture of right shoulder, not specified as traumatic: Secondary | ICD-10-CM | POA: Diagnosis not present

## 2019-04-26 DIAGNOSIS — M7541 Impingement syndrome of right shoulder: Secondary | ICD-10-CM | POA: Diagnosis not present

## 2019-04-26 DIAGNOSIS — I9789 Other postprocedural complications and disorders of the circulatory system, not elsewhere classified: Secondary | ICD-10-CM | POA: Diagnosis not present

## 2019-04-26 DIAGNOSIS — S43491A Other sprain of right shoulder joint, initial encounter: Secondary | ICD-10-CM | POA: Diagnosis not present

## 2019-05-04 DIAGNOSIS — M25511 Pain in right shoulder: Secondary | ICD-10-CM | POA: Diagnosis not present

## 2019-05-06 DIAGNOSIS — I9789 Other postprocedural complications and disorders of the circulatory system, not elsewhere classified: Secondary | ICD-10-CM | POA: Diagnosis not present

## 2019-05-06 DIAGNOSIS — M7541 Impingement syndrome of right shoulder: Secondary | ICD-10-CM | POA: Diagnosis not present

## 2019-05-06 DIAGNOSIS — M25511 Pain in right shoulder: Secondary | ICD-10-CM | POA: Diagnosis not present

## 2019-05-06 DIAGNOSIS — Z4889 Encounter for other specified surgical aftercare: Secondary | ICD-10-CM | POA: Diagnosis not present

## 2019-05-11 DIAGNOSIS — M25511 Pain in right shoulder: Secondary | ICD-10-CM | POA: Diagnosis not present

## 2019-05-13 DIAGNOSIS — M25511 Pain in right shoulder: Secondary | ICD-10-CM | POA: Diagnosis not present

## 2019-05-17 DIAGNOSIS — M25511 Pain in right shoulder: Secondary | ICD-10-CM | POA: Diagnosis not present

## 2019-05-18 DIAGNOSIS — M25511 Pain in right shoulder: Secondary | ICD-10-CM | POA: Diagnosis not present

## 2019-05-19 DIAGNOSIS — M25511 Pain in right shoulder: Secondary | ICD-10-CM | POA: Diagnosis not present

## 2019-05-19 DIAGNOSIS — Z4889 Encounter for other specified surgical aftercare: Secondary | ICD-10-CM | POA: Diagnosis not present

## 2019-05-19 DIAGNOSIS — M7541 Impingement syndrome of right shoulder: Secondary | ICD-10-CM | POA: Diagnosis not present

## 2019-05-19 DIAGNOSIS — I9789 Other postprocedural complications and disorders of the circulatory system, not elsewhere classified: Secondary | ICD-10-CM | POA: Diagnosis not present

## 2019-05-25 DIAGNOSIS — M25511 Pain in right shoulder: Secondary | ICD-10-CM | POA: Diagnosis not present

## 2019-05-27 DIAGNOSIS — M25511 Pain in right shoulder: Secondary | ICD-10-CM | POA: Diagnosis not present

## 2019-05-30 DIAGNOSIS — M25511 Pain in right shoulder: Secondary | ICD-10-CM | POA: Diagnosis not present

## 2019-06-02 DIAGNOSIS — M25511 Pain in right shoulder: Secondary | ICD-10-CM | POA: Diagnosis not present

## 2019-06-06 DIAGNOSIS — M25511 Pain in right shoulder: Secondary | ICD-10-CM | POA: Diagnosis not present

## 2019-06-08 DIAGNOSIS — M25511 Pain in right shoulder: Secondary | ICD-10-CM | POA: Diagnosis not present

## 2019-10-05 DIAGNOSIS — E890 Postprocedural hypothyroidism: Secondary | ICD-10-CM | POA: Diagnosis not present

## 2019-10-05 DIAGNOSIS — Z85850 Personal history of malignant neoplasm of thyroid: Secondary | ICD-10-CM | POA: Diagnosis not present

## 2020-02-06 DIAGNOSIS — H6121 Impacted cerumen, right ear: Secondary | ICD-10-CM | POA: Diagnosis not present

## 2020-02-06 DIAGNOSIS — Z23 Encounter for immunization: Secondary | ICD-10-CM | POA: Diagnosis not present

## 2020-02-24 DIAGNOSIS — Z0000 Encounter for general adult medical examination without abnormal findings: Secondary | ICD-10-CM | POA: Diagnosis not present

## 2020-02-24 DIAGNOSIS — Z13220 Encounter for screening for lipoid disorders: Secondary | ICD-10-CM | POA: Diagnosis not present

## 2022-07-04 DIAGNOSIS — M25512 Pain in left shoulder: Secondary | ICD-10-CM | POA: Diagnosis not present

## 2022-07-15 DIAGNOSIS — Z1159 Encounter for screening for other viral diseases: Secondary | ICD-10-CM | POA: Diagnosis not present

## 2022-07-15 DIAGNOSIS — Z136 Encounter for screening for cardiovascular disorders: Secondary | ICD-10-CM | POA: Diagnosis not present

## 2022-07-15 DIAGNOSIS — Z23 Encounter for immunization: Secondary | ICD-10-CM | POA: Diagnosis not present

## 2022-07-15 DIAGNOSIS — Z13220 Encounter for screening for lipoid disorders: Secondary | ICD-10-CM | POA: Diagnosis not present

## 2022-07-15 DIAGNOSIS — Z131 Encounter for screening for diabetes mellitus: Secondary | ICD-10-CM | POA: Diagnosis not present

## 2022-07-15 DIAGNOSIS — Z0000 Encounter for general adult medical examination without abnormal findings: Secondary | ICD-10-CM | POA: Diagnosis not present

## 2022-07-16 DIAGNOSIS — M25512 Pain in left shoulder: Secondary | ICD-10-CM | POA: Diagnosis not present

## 2022-08-11 DIAGNOSIS — M25512 Pain in left shoulder: Secondary | ICD-10-CM | POA: Diagnosis not present

## 2022-09-24 DIAGNOSIS — M7542 Impingement syndrome of left shoulder: Secondary | ICD-10-CM | POA: Diagnosis not present

## 2022-10-09 DIAGNOSIS — Z85850 Personal history of malignant neoplasm of thyroid: Secondary | ICD-10-CM | POA: Diagnosis not present

## 2022-10-09 DIAGNOSIS — E890 Postprocedural hypothyroidism: Secondary | ICD-10-CM | POA: Diagnosis not present

## 2022-11-11 DIAGNOSIS — M19112 Post-traumatic osteoarthritis, left shoulder: Secondary | ICD-10-CM | POA: Diagnosis not present

## 2022-11-11 DIAGNOSIS — M94212 Chondromalacia, left shoulder: Secondary | ICD-10-CM | POA: Diagnosis not present

## 2022-11-11 DIAGNOSIS — M19012 Primary osteoarthritis, left shoulder: Secondary | ICD-10-CM | POA: Diagnosis not present

## 2022-11-11 DIAGNOSIS — M24612 Ankylosis, left shoulder: Secondary | ICD-10-CM | POA: Diagnosis not present

## 2022-11-11 DIAGNOSIS — S43432A Superior glenoid labrum lesion of left shoulder, initial encounter: Secondary | ICD-10-CM | POA: Diagnosis not present

## 2022-11-11 DIAGNOSIS — M659 Synovitis and tenosynovitis, unspecified: Secondary | ICD-10-CM | POA: Diagnosis not present

## 2022-11-11 DIAGNOSIS — M75112 Incomplete rotator cuff tear or rupture of left shoulder, not specified as traumatic: Secondary | ICD-10-CM | POA: Diagnosis not present

## 2022-11-11 DIAGNOSIS — M65812 Other synovitis and tenosynovitis, left shoulder: Secondary | ICD-10-CM | POA: Diagnosis not present

## 2022-11-11 DIAGNOSIS — M7542 Impingement syndrome of left shoulder: Secondary | ICD-10-CM | POA: Diagnosis not present

## 2022-11-11 DIAGNOSIS — M7552 Bursitis of left shoulder: Secondary | ICD-10-CM | POA: Diagnosis not present

## 2022-11-11 DIAGNOSIS — G8918 Other acute postprocedural pain: Secondary | ICD-10-CM | POA: Diagnosis not present

## 2022-11-11 DIAGNOSIS — M24112 Other articular cartilage disorders, left shoulder: Secondary | ICD-10-CM | POA: Diagnosis not present

## 2022-11-11 DIAGNOSIS — M948X1 Other specified disorders of cartilage, shoulder: Secondary | ICD-10-CM | POA: Diagnosis not present

## 2022-11-11 DIAGNOSIS — S46112A Strain of muscle, fascia and tendon of long head of biceps, left arm, initial encounter: Secondary | ICD-10-CM | POA: Diagnosis not present

## 2022-11-19 DIAGNOSIS — M25512 Pain in left shoulder: Secondary | ICD-10-CM | POA: Diagnosis not present

## 2022-12-02 DIAGNOSIS — M25512 Pain in left shoulder: Secondary | ICD-10-CM | POA: Diagnosis not present

## 2022-12-02 DIAGNOSIS — Z01419 Encounter for gynecological examination (general) (routine) without abnormal findings: Secondary | ICD-10-CM | POA: Diagnosis not present

## 2022-12-02 DIAGNOSIS — Z1231 Encounter for screening mammogram for malignant neoplasm of breast: Secondary | ICD-10-CM | POA: Diagnosis not present

## 2022-12-02 DIAGNOSIS — Z6825 Body mass index (BMI) 25.0-25.9, adult: Secondary | ICD-10-CM | POA: Diagnosis not present

## 2022-12-04 DIAGNOSIS — D1722 Benign lipomatous neoplasm of skin and subcutaneous tissue of left arm: Secondary | ICD-10-CM | POA: Diagnosis not present

## 2022-12-04 DIAGNOSIS — L821 Other seborrheic keratosis: Secondary | ICD-10-CM | POA: Diagnosis not present

## 2022-12-04 DIAGNOSIS — M25512 Pain in left shoulder: Secondary | ICD-10-CM | POA: Diagnosis not present

## 2022-12-04 DIAGNOSIS — D2272 Melanocytic nevi of left lower limb, including hip: Secondary | ICD-10-CM | POA: Diagnosis not present

## 2022-12-04 DIAGNOSIS — L720 Epidermal cyst: Secondary | ICD-10-CM | POA: Diagnosis not present

## 2022-12-04 DIAGNOSIS — D225 Melanocytic nevi of trunk: Secondary | ICD-10-CM | POA: Diagnosis not present

## 2022-12-09 DIAGNOSIS — M25512 Pain in left shoulder: Secondary | ICD-10-CM | POA: Diagnosis not present

## 2022-12-11 DIAGNOSIS — M25512 Pain in left shoulder: Secondary | ICD-10-CM | POA: Diagnosis not present

## 2022-12-16 DIAGNOSIS — M25512 Pain in left shoulder: Secondary | ICD-10-CM | POA: Diagnosis not present

## 2022-12-18 DIAGNOSIS — M25512 Pain in left shoulder: Secondary | ICD-10-CM | POA: Diagnosis not present

## 2022-12-23 DIAGNOSIS — M25512 Pain in left shoulder: Secondary | ICD-10-CM | POA: Diagnosis not present

## 2022-12-25 DIAGNOSIS — M25512 Pain in left shoulder: Secondary | ICD-10-CM | POA: Diagnosis not present

## 2023-01-09 DIAGNOSIS — M25512 Pain in left shoulder: Secondary | ICD-10-CM | POA: Diagnosis not present

## 2023-01-12 DIAGNOSIS — M25512 Pain in left shoulder: Secondary | ICD-10-CM | POA: Diagnosis not present

## 2023-01-14 DIAGNOSIS — M25512 Pain in left shoulder: Secondary | ICD-10-CM | POA: Diagnosis not present

## 2023-01-22 DIAGNOSIS — M25512 Pain in left shoulder: Secondary | ICD-10-CM | POA: Diagnosis not present

## 2023-02-02 DIAGNOSIS — E890 Postprocedural hypothyroidism: Secondary | ICD-10-CM | POA: Diagnosis not present

## 2023-02-03 DIAGNOSIS — M25512 Pain in left shoulder: Secondary | ICD-10-CM | POA: Diagnosis not present

## 2023-02-10 DIAGNOSIS — M25512 Pain in left shoulder: Secondary | ICD-10-CM | POA: Diagnosis not present

## 2023-02-11 DIAGNOSIS — E890 Postprocedural hypothyroidism: Secondary | ICD-10-CM | POA: Diagnosis not present

## 2023-02-11 DIAGNOSIS — Z85850 Personal history of malignant neoplasm of thyroid: Secondary | ICD-10-CM | POA: Diagnosis not present

## 2023-09-15 DIAGNOSIS — Z131 Encounter for screening for diabetes mellitus: Secondary | ICD-10-CM | POA: Diagnosis not present

## 2023-09-15 DIAGNOSIS — E782 Mixed hyperlipidemia: Secondary | ICD-10-CM | POA: Diagnosis not present

## 2023-10-15 ENCOUNTER — Ambulatory Visit: Admitting: Plastic Surgery

## 2023-10-15 ENCOUNTER — Encounter: Payer: Self-pay | Admitting: Plastic Surgery

## 2023-10-15 DIAGNOSIS — Z85850 Personal history of malignant neoplasm of thyroid: Secondary | ICD-10-CM | POA: Diagnosis not present

## 2023-10-15 DIAGNOSIS — D489 Neoplasm of uncertain behavior, unspecified: Secondary | ICD-10-CM

## 2023-10-15 DIAGNOSIS — R2232 Localized swelling, mass and lump, left upper limb: Secondary | ICD-10-CM | POA: Diagnosis not present

## 2023-10-15 DIAGNOSIS — E890 Postprocedural hypothyroidism: Secondary | ICD-10-CM | POA: Diagnosis not present

## 2023-10-15 NOTE — Progress Notes (Signed)
 Referring Provider Oralee Billow, MD [address] [place],  [place] [address]   CC:  Chief Complaint  Patient presents with   Advice Only      Allison [name] is an 68 y.o. female.  HPI: Allison [name] is a 68 year old female who presents today for evaluation of a soft tissue mass on the posterior left shoulder.  The mass has been present for approximately 2 years now but has increased in size.  She denies any pain with the mass and there is no limitation to range of motion in her shoulder.  She is interested in having this removed.  Allergies  Allergen Reactions   Biaxin [Clarithromycin]     HIVES    Outpatient Encounter Medications as of 10/15/2023  Medication Sig Note   Calcium-Phosphorus-Vitamin D (CALCIUM GUMMIES PO) Take 2 capsules by mouth daily.    levothyroxine (SYNTHROID, LEVOTHROID) 112 MCG tablet Take 112 mcg by mouth daily before breakfast. 02/06/2017: BRAND ONLY    oxyCODONE -acetaminophen  (PERCOCET) 7.5-325 MG tablet Take 1 tablet by mouth every 4 (four) hours as needed for severe pain. (Patient not taking: Reported on 10/15/2023)    No facility-administered encounter medications on file as of 10/15/2023.     Past Medical History:  Diagnosis Date   Cancer (HCC)    thyroid  cancer    Hypothyroidism     Past Surgical History:  Procedure Laterality Date   ANTERIOR AND POSTERIOR REPAIR WITH SACROSPINOUS FIXATION N/A 02/16/2017   Procedure: ANTERIOR AND POSTERIOR REPAIR WITH SACROSPINOUS FIXATION;  Surgeon: Merryl Abraham, MD;  Location: [facility] [place];  Service: Gynecology;  Laterality: N/A;   LAPAROSCOPIC VAGINAL HYSTERECTOMY WITH SALPINGO OOPHORECTOMY Bilateral 02/16/2017   Procedure: LAPAROSCOPIC ASSISTED VAGINAL HYSTERECTOMY WITH SALPINGO OOPHORECTOMY;  Surgeon: Merryl Abraham, MD;  Location: [facility] [place];  Service: Gynecology;  Laterality: Bilateral;   THYROIDECTOMY      No family history on file.  Social History    Social History Narrative   Not on file     Review of Systems General: Denies fevers, chills, weight loss CV: Denies chest pain, shortness of breath, palpitations Left shoulder: Soft tissue mass which is nonpainful on nonlimiting.  Has grown slightly.  She denies any trauma to the area.  Physical Exam    02/17/2017    7:45 AM 02/17/2017    6:34 AM 02/17/2017    2:43 AM  Vitals with BMI  Systolic 105 104 956  Diastolic 53 48 64  Pulse 45 56 49    General:  No acute distress,  Alert and oriented, Non-Toxic, Normal speech and affect Left shoulder: Patient has a soft tissue mass approximately 2 x 2 cm.  It is on the posterior aspect of the left shoulder.  It is soft nontender and mobile. 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  We discussed lipomas at length including the fact that they are benign tumors which do not necessarily have to be removed.  Removal is reasonable for concerns about pathology.  I cannot give her a percentage chance of increase in size or recurrence if it is removed.  She understands this.  I do think removal is reasonable.  I showed her the location of the scar.  She should have minimal downtime after the procedure.  I believe it can be performed in the office safely under local anesthesia.  She would like to proceed.  Will get her scheduled for an  office removal of the mass.  Allison [name] 10/15/2023, 11:02 AM

## 2023-11-03 DIAGNOSIS — Z0184 Encounter for antibody response examination: Secondary | ICD-10-CM | POA: Diagnosis not present

## 2023-11-11 ENCOUNTER — Ambulatory Visit: Admitting: Plastic Surgery

## 2023-11-12 DIAGNOSIS — E890 Postprocedural hypothyroidism: Secondary | ICD-10-CM | POA: Diagnosis not present

## 2023-12-03 DIAGNOSIS — H43813 Vitreous degeneration, bilateral: Secondary | ICD-10-CM | POA: Diagnosis not present

## 2023-12-03 DIAGNOSIS — H5212 Myopia, left eye: Secondary | ICD-10-CM | POA: Diagnosis not present

## 2023-12-03 DIAGNOSIS — H2513 Age-related nuclear cataract, bilateral: Secondary | ICD-10-CM | POA: Diagnosis not present

## 2023-12-03 DIAGNOSIS — H52203 Unspecified astigmatism, bilateral: Secondary | ICD-10-CM | POA: Diagnosis not present

## 2023-12-03 DIAGNOSIS — H04123 Dry eye syndrome of bilateral lacrimal glands: Secondary | ICD-10-CM | POA: Diagnosis not present

## 2023-12-03 DIAGNOSIS — H524 Presbyopia: Secondary | ICD-10-CM | POA: Diagnosis not present

## 2023-12-22 DIAGNOSIS — M8588 Other specified disorders of bone density and structure, other site: Secondary | ICD-10-CM | POA: Diagnosis not present

## 2023-12-22 DIAGNOSIS — Z1231 Encounter for screening mammogram for malignant neoplasm of breast: Secondary | ICD-10-CM | POA: Diagnosis not present

## 2024-02-22 DIAGNOSIS — D2272 Melanocytic nevi of left lower limb, including hip: Secondary | ICD-10-CM | POA: Diagnosis not present

## 2024-02-22 DIAGNOSIS — D1722 Benign lipomatous neoplasm of skin and subcutaneous tissue of left arm: Secondary | ICD-10-CM | POA: Diagnosis not present

## 2024-02-22 DIAGNOSIS — D2262 Melanocytic nevi of left upper limb, including shoulder: Secondary | ICD-10-CM | POA: Diagnosis not present

## 2024-02-22 DIAGNOSIS — D485 Neoplasm of uncertain behavior of skin: Secondary | ICD-10-CM | POA: Diagnosis not present

## 2024-02-22 DIAGNOSIS — D2261 Melanocytic nevi of right upper limb, including shoulder: Secondary | ICD-10-CM | POA: Diagnosis not present

## 2024-02-22 DIAGNOSIS — L821 Other seborrheic keratosis: Secondary | ICD-10-CM | POA: Diagnosis not present

## 2024-03-14 DIAGNOSIS — D2262 Melanocytic nevi of left upper limb, including shoulder: Secondary | ICD-10-CM | POA: Diagnosis not present

## 2024-03-14 DIAGNOSIS — D485 Neoplasm of uncertain behavior of skin: Secondary | ICD-10-CM | POA: Diagnosis not present
